# Patient Record
Sex: Female | Born: 2000
Health system: Southern US, Community
[De-identification: ages and names within clinical notes are randomized; demographics above are authoritative.]

## PROBLEM LIST (undated history)

## (undated) DIAGNOSIS — F909 Attention-deficit hyperactivity disorder, unspecified type: Secondary | ICD-10-CM

## (undated) DIAGNOSIS — J45990 Exercise induced bronchospasm: Secondary | ICD-10-CM

## (undated) HISTORY — PX: WISDOM TOOTH EXTRACTION: SHX21

---
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
## 2020-02-19 DIAGNOSIS — F902 Attention-deficit hyperactivity disorder, combined type: Secondary | ICD-10-CM | POA: Diagnosis not present

## 2020-03-26 DIAGNOSIS — K0889 Other specified disorders of teeth and supporting structures: Secondary | ICD-10-CM | POA: Diagnosis not present

## 2020-03-26 DIAGNOSIS — F32A Depression, unspecified: Secondary | ICD-10-CM | POA: Diagnosis not present

## 2020-03-26 DIAGNOSIS — F900 Attention-deficit hyperactivity disorder, predominantly inattentive type: Secondary | ICD-10-CM | POA: Diagnosis not present

## 2020-05-06 DIAGNOSIS — F900 Attention-deficit hyperactivity disorder, predominantly inattentive type: Secondary | ICD-10-CM | POA: Diagnosis not present

## 2020-05-06 DIAGNOSIS — F419 Anxiety disorder, unspecified: Secondary | ICD-10-CM | POA: Diagnosis not present

## 2020-06-04 DIAGNOSIS — J020 Streptococcal pharyngitis: Secondary | ICD-10-CM | POA: Diagnosis not present

## 2020-07-17 DIAGNOSIS — F902 Attention-deficit hyperactivity disorder, combined type: Secondary | ICD-10-CM | POA: Diagnosis not present

## 2020-08-11 DIAGNOSIS — F902 Attention-deficit hyperactivity disorder, combined type: Secondary | ICD-10-CM | POA: Diagnosis not present

## 2021-01-09 DIAGNOSIS — Z113 Encounter for screening for infections with a predominantly sexual mode of transmission: Secondary | ICD-10-CM | POA: Diagnosis not present

## 2021-04-07 ENCOUNTER — Ambulatory Visit: Payer: Federal, State, Local not specified - PPO | Admitting: Nurse Practitioner

## 2021-04-07 ENCOUNTER — Encounter: Payer: Self-pay | Admitting: Nurse Practitioner

## 2021-04-07 ENCOUNTER — Other Ambulatory Visit: Payer: Self-pay

## 2021-04-07 VITALS — BP 105/69 | HR 94 | Temp 98.1°F | Ht 62.0 in | Wt 133.1 lb

## 2021-04-07 DIAGNOSIS — F411 Generalized anxiety disorder: Secondary | ICD-10-CM | POA: Diagnosis not present

## 2021-04-07 DIAGNOSIS — F988 Other specified behavioral and emotional disorders with onset usually occurring in childhood and adolescence: Secondary | ICD-10-CM | POA: Insufficient documentation

## 2021-04-07 DIAGNOSIS — Z7689 Persons encountering health services in other specified circumstances: Secondary | ICD-10-CM

## 2021-04-07 DIAGNOSIS — Z30011 Encounter for initial prescription of contraceptive pills: Secondary | ICD-10-CM

## 2021-04-07 MED ORDER — ESCITALOPRAM OXALATE 10 MG PO TABS: 10.0000 mg | ORAL_TABLET | Freq: Every day | ORAL | 1 refills | Status: DC

## 2021-04-07 MED ORDER — SPRINTEC 28 0.25-35 MG-MCG PO TABS: 1.0000 | ORAL_TABLET | Freq: Every day | ORAL | 11 refills | Status: DC

## 2021-04-07 MED ORDER — LISDEXAMFETAMINE DIMESYLATE 40 MG PO CAPS: 40.0000 mg | ORAL_CAPSULE | Freq: Every day | ORAL | 0 refills | Status: DC

## 2021-04-07 NOTE — Progress Notes (Signed)
? ?New Patient Office Visit ? ?Subjective:  ?Patient ID: Christina DecampSarah Khan, female    DOB: Feb 04, 2000  Age: 21 y.o. MRN: [id number] ? ?CC:  ?Chief Complaint  ?Patient presents with  ? New Patient (Initial Visit)  ? ? ?HPI ?Christina DecampSarah Khan presents to establish new primary care provider. [facility]. The patient has history od depression/anxiety. Currently takes lexapro 10mg  daily. States that it was doing so well at one time, she tried coming off the medication. She states that she did have to restart the medication as she started having symptoms again.  ?The patient is a LawyerCNA at [employer]. She does take vyvanse 40mg  daily on days when she is working. She does take "holiday" from the medication on days off. She states that this works well to keep her on track and focused while at work.  ?She does take sprintec for birth control. She states that menstrual cycles are light, regular, and with very little cramping. She does need refills for this today.  ? ?Past Medical History:  ?Diagnosis Date  ? ADHD   ? Exercise-induced asthma   ? ? ?Past Surgical History:  ?Procedure Laterality Date  ? WISDOM TOOTH EXTRACTION    ? ? ?Family History  ?Problem Relation Age of Onset  ? Healthy Mother   ? High Cholesterol Mother   ? High Cholesterol Father   ? Healthy Father   ? High blood pressure Father   ? Cancer Maternal Grandmother   ? Alcoholism Maternal Grandmother   ? Alcoholism Maternal Grandfather   ? Heart attack Maternal Grandfather   ? ? ?Social History  ? ?Socioeconomic History  ? Marital status: Single  ?  Spouse name: Not on file  ? Number of children: Not on file  ? Years of education: Not on file  ? Highest education level: Not on file  ?Occupational History  ? Not on file  ?Tobacco Use  ? Smoking status: Never  ? Smokeless tobacco: Never  ?Vaping Use  ? Vaping Use: Never used  ?Substance and Sexual Activity  ? Alcohol use: Not Currently  ?  Alcohol/week: 0.0 standard drinks  ?  Drug use: Never  ? Sexual activity: Not Currently  ?Other Topics Concern  ? Not on file  ?Social History Narrative  ? Not on file  ? ?Social Determinants of Health  ? ?Financial Resource Strain: Not on file  ?Food Insecurity: Not on file  ?Transportation Needs: Not on file  ?Physical Activity: Not on file  ?Stress: Not on file  ?Social Connections: Not on file  ?Intimate Partner Violence: Not on file  ? ? ?ROS ?Review of Systems  ?Constitutional:  Negative for activity change, appetite change, chills, fatigue and fever.  ?HENT:  Negative for congestion, postnasal drip, rhinorrhea, sinus pressure, sinus pain, sneezing and sore throat.   ?Eyes: Negative.   ?Respiratory:  Negative for cough, chest tightness, shortness of breath and wheezing.   ?Cardiovascular:  Negative for chest pain and palpitations.  ?Gastrointestinal:  Negative for abdominal pain, constipation, diarrhea, nausea and vomiting.  ?Endocrine: Negative for cold intolerance, heat intolerance, polydipsia and polyuria.  ?Genitourinary:  Negative for dyspareunia, dysuria, flank pain, frequency and urgency.  ?Musculoskeletal:  Negative for arthralgias, back pain and myalgias.  ?Skin:  Negative for rash.  ?Allergic/Immunologic: Negative for environmental allergies.  ?Neurological:  Negative for dizziness, weakness and headaches.  ?Hematological:  Negative for adenopathy.  ?Psychiatric/Behavioral:  Positive for decreased concentration and dysphoric mood. The patient  is not nervous/anxious.   ?     Well managed on current medications  ? ?Objective:  ? ?Today's Vitals  ? 04/07/21 1357  ?BP: 105/69  ?Pulse: 94  ?Temp: 98.1 ?F (36.7 ?C)  ?SpO2: 100%  ?Weight: 133 lb 1.9 oz (60.4 kg)  ?Height: 5\' 2"  (1.575 m)  ? ?Body mass index is 24.35 kg/m?.  ? ?Physical Exam ?Vitals and nursing note reviewed.  ?Constitutional:   ?   Appearance: Normal appearance. She is well-developed.  ?HENT:  ?   Head: Normocephalic and atraumatic.  ?Eyes:  ?   Pupils: Pupils are equal, round,  and reactive to light.  ?Cardiovascular:  ?   Rate and Rhythm: Normal rate and regular rhythm.  ?   Pulses: Normal pulses.  ?   Heart sounds: Normal heart sounds.  ?Pulmonary:  ?   Effort: Pulmonary effort is normal.  ?   Breath sounds: Normal breath sounds.  ?Abdominal:  ?   Palpations: Abdomen is soft.  ?Musculoskeletal:     ?   General: Normal range of motion.  ?   Cervical back: Normal range of motion and neck supple.  ?Lymphadenopathy:  ?   Cervical: No cervical adenopathy.  ?Skin: ?   General: Skin is warm and dry.  ?   Capillary Refill: Capillary refill takes less than 2 seconds.  ?Neurological:  ?   General: No focal deficit present.  ?   Mental Status: She is alert and oriented to person, place, and time.  ?Psychiatric:     ?   Mood and Affect: Mood normal.     ?   Behavior: Behavior normal.     ?   Thought Content: Thought content normal.     ?   Judgment: Judgment normal.  ? ? ?Assessment & Plan:  ?1. Generalized anxiety disorder ?Well managed on curent dose lexapro. Continue 10mg  daily. New prescription sent to her pharmacy today  ?- escitalopram (LEXAPRO) 10 MG tablet; Take 1 tablet (10 mg total) by mouth at bedtime.  Dispense: 90 tablet; Refill: 1 ? ?2. Attention deficit disorder (ADD) without hyperactivity ?Doing well on current dose vyvanse 40mg . May conitnue daily as needed. Two 30 day prescriptions sent to her pharmacy. Dates are 04/07/2021 and 05/06/2021 ?- lisdexamfetamine (VYVANSE) 40 MG capsule; Take 1 capsule (40 mg total) by mouth daily.  Dispense: 30 capsule; Refill: 0 ? ?3. Encounter for prescription of oral contraceptives ?Renew OCP.  ?- SPRINTEC 28 0.25-35 MG-MCG tablet; Take 1 tablet by mouth daily.  Dispense: 28 tablet; Refill: 11 ? ?4. Encounter to establish care ?Appointment today to establish new primary care provider   ? ?  ?Problem List Items Addressed This Visit   ? ?  ? Other  ? Generalized anxiety disorder - Primary  ? Relevant Medications  ? escitalopram (LEXAPRO) 10 MG tablet  ?  Attention deficit disorder (ADD) without hyperactivity  ? Relevant Medications  ? lisdexamfetamine (VYVANSE) 40 MG capsule  ? ?Other Visit Diagnoses   ? ? Encounter for prescription of oral contraceptives      ? Relevant Medications  ? SPRINTEC 28 0.25-35 MG-MCG tablet  ? Encounter to establish care      ? ?  ? ? ?Outpatient Encounter Medications as of 04/07/2021  ?Medication Sig  ? Adapalene (DIFFERIN) 0.3 % gel Apply 1 application topically at bedtime.  ? [DISCONTINUED] escitalopram (LEXAPRO) 10 MG tablet Take 10 mg by mouth at bedtime.  ? [DISCONTINUED] lisdexamfetamine (VYVANSE) 20 MG capsule TAKE  1 CAPSULE BY MOUTH DAILY IN THE MORNING X 5 DAYS. IF TOLERATED, START 2 CAPS IN MORNING  ? [DISCONTINUED] SPRINTEC 28 0.25-35 MG-MCG tablet Take 1 tablet by mouth daily.  ? escitalopram (LEXAPRO) 10 MG tablet Take 1 tablet (10 mg total) by mouth at bedtime.  ? lisdexamfetamine (VYVANSE) 40 MG capsule Take 1 capsule (40 mg total) by mouth daily.  ? SPRINTEC 28 0.25-35 MG-MCG tablet Take 1 tablet by mouth daily.  ? [DISCONTINUED] benzonatate (TESSALON) 100 MG capsule Take 1 capsule (100 mg total) by mouth 3 (three) times daily as needed for cough.  ? [DISCONTINUED] lisdexamfetamine (VYVANSE) 40 MG capsule Take 1 capsule (40 mg total) by mouth daily.  ? [DISCONTINUED] Sulfacetamide Sodium-Sulfur (SULFACLEANSE 8/4) 8-4 % SUSP Apply to skin qd-bid  ? ?No facility-administered encounter medications on file as of 04/07/2021.  ? ? ?Follow-up: Return in about 6 weeks (around 05/19/2021) for health maintenance exam.  ? ?Carlean Jews, NP ? ?

## 2021-05-19 ENCOUNTER — Ambulatory Visit (INDEPENDENT_AMBULATORY_CARE_PROVIDER_SITE_OTHER): Payer: Federal, State, Local not specified - PPO | Admitting: Nurse Practitioner

## 2021-05-19 DIAGNOSIS — Z91199 Patient's noncompliance with other medical treatment and regimen due to unspecified reason: Secondary | ICD-10-CM

## 2021-05-19 NOTE — Progress Notes (Deleted)
Established patient visit   Patient: Christina Khan   DOB: 09-11-2000   21 y.o. Female  MRN: [id number] Visit Date: 05/19/2021   No chief complaint on file.  Subjective    HPI  Visit not started or completed as patient was a no show    Medications: Outpatient Medications Prior to Visit  Medication Sig   Adapalene (DIFFERIN) 0.3 % gel Apply 1 application topically at bedtime.   escitalopram (LEXAPRO) 10 MG tablet Take 1 tablet (10 mg total) by mouth at bedtime.   lisdexamfetamine (VYVANSE) 40 MG capsule Take 1 capsule (40 mg total) by mouth daily.   SPRINTEC 28 0.25-35 MG-MCG tablet Take 1 tablet by mouth daily.   No facility-administered medications prior to visit.    Review of Systems  {Labs (Optional):23779}   Objective    There were no vitals taken for this visit. BP Readings from Last 3 Encounters:  04/07/21 105/69  10/11/19 98/69  06/24/14 114/68 (78 %, Z = 0.77 /  71 %, Z = 0.55)*   *BP percentiles are based on the 2017 AAP Clinical Practice Guideline for girls    Wt Readings from Last 3 Encounters:  04/07/21 133 lb 1.9 oz (60.4 kg)  06/24/14 111 lb (50.3 kg) (61 %, Z= 0.27)*   * Growth percentiles are based on CDC (Girls, 2-20 Years) data.    Physical Exam  ***  No results found for any visits on 05/19/21.  Assessment & Plan     Problem List Items Addressed This Visit   None    No follow-ups on file.         Ronnell Freshwater, NP  [facility] [phone] (phone) [phone] (fax)  [place]

## 2021-07-18 ENCOUNTER — Other Ambulatory Visit: Payer: Self-pay

## 2021-07-18 ENCOUNTER — Emergency Department (HOSPITAL_BASED_OUTPATIENT_CLINIC_OR_DEPARTMENT_OTHER)
Admission: EM | Admit: 2021-07-18 | Discharge: 2021-07-19 | Disposition: A | Discharge status: Home / Self Care | Payer: Federal, State, Local not specified - PPO | Attending: Emergency Medicine | Admitting: Emergency Medicine

## 2021-07-18 DIAGNOSIS — D72829 Elevated white blood cell count, unspecified: Secondary | ICD-10-CM | POA: Diagnosis not present

## 2021-07-18 DIAGNOSIS — N938 Other specified abnormal uterine and vaginal bleeding: Secondary | ICD-10-CM | POA: Diagnosis not present

## 2021-07-18 DIAGNOSIS — N179 Acute kidney failure, unspecified: Secondary | ICD-10-CM | POA: Insufficient documentation

## 2021-07-18 DIAGNOSIS — N390 Urinary tract infection, site not specified: Secondary | ICD-10-CM | POA: Diagnosis not present

## 2021-07-18 DIAGNOSIS — R1030 Lower abdominal pain, unspecified: Secondary | ICD-10-CM

## 2021-07-18 DIAGNOSIS — R1032 Left lower quadrant pain: Secondary | ICD-10-CM | POA: Diagnosis not present

## 2021-07-18 DIAGNOSIS — F419 Anxiety disorder, unspecified: Secondary | ICD-10-CM | POA: Diagnosis not present

## 2021-07-18 DIAGNOSIS — J45998 Other asthma: Secondary | ICD-10-CM | POA: Insufficient documentation

## 2021-07-18 DIAGNOSIS — N12 Tubulo-interstitial nephritis, not specified as acute or chronic: Secondary | ICD-10-CM | POA: Insufficient documentation

## 2021-07-18 LAB — WET PREP, GENITAL
Clue Cells Wet Prep HPF POC: NONE SEEN
Sperm: NONE SEEN
Trich, Wet Prep: NONE SEEN
WBC, Wet Prep HPF POC: <10 (ref <10)
Yeast Wet Prep HPF POC: NONE SEEN

## 2021-07-18 LAB — CBC
HCT: 36 % (ref 36.0–46.0)
Hemoglobin: 11.7 g/dL — ABNORMAL LOW (ref 12.0–15.0)
MCH: 28.1 pg (ref 26.0–34.0)
MCHC: 32.5 g/dL (ref 30.0–36.0)
MCV: 86.3 fL (ref 80.0–100.0)
Platelets: 252 10*3/uL (ref 150–400)
RBC: 4.17 MIL/uL (ref 3.87–5.11)
RDW: 14 % (ref 11.5–15.5)
WBC: 13.6 10*3/uL — ABNORMAL HIGH (ref 4.0–10.5)
nRBC: 0 % (ref 0.0–0.2)

## 2021-07-18 LAB — COMPREHENSIVE METABOLIC PANEL
ALT: 23 U/L (ref 0–44)
AST: 52 U/L — ABNORMAL HIGH (ref 15–41)
Albumin: 3.9 g/dL (ref 3.5–5.0)
Alkaline Phosphatase: 37 U/L — ABNORMAL LOW (ref 38–126)
Anion gap: 12 (ref 5–15)
BUN: 9 mg/dL (ref 6–20)
CO2: 22 mmol/L (ref 22–32)
Calcium: 9.4 mg/dL (ref 8.9–10.3)
Chloride: 100 mmol/L (ref 98–111)
Creatinine, Ser: 0.84 mg/dL (ref 0.44–1.00)
GFR, Estimated: >60 mL/min (ref >60)
Glucose, Bld: 181 mg/dL — ABNORMAL HIGH (ref 70–99)
Potassium: 3.2 mmol/L — ABNORMAL LOW (ref 3.5–5.1)
Sodium: 134 mmol/L — ABNORMAL LOW (ref 135–145)
Total Bilirubin: 0.8 mg/dL (ref 0.3–1.2)
Total Protein: 7.2 g/dL (ref 6.5–8.1)

## 2021-07-18 LAB — URINALYSIS, ROUTINE W REFLEX MICROSCOPIC
Bilirubin Urine: NEGATIVE
Glucose, UA: NEGATIVE mg/dL
Ketones, ur: 40 mg/dL — AB
Nitrite: NEGATIVE
Protein, ur: 30 mg/dL — AB
Specific Gravity, Urine: 1.015 (ref 1.005–1.030)
Trans Epithel, UA: 6
WBC, UA: >50 WBC/hpf — ABNORMAL HIGH (ref 0–5)
pH: 6 (ref 5.0–8.0)

## 2021-07-18 LAB — PREGNANCY, URINE: Preg Test, Ur: NEGATIVE

## 2021-07-18 LAB — LIPASE, BLOOD: Lipase: <10 U/L — ABNORMAL LOW (ref 11–51)

## 2021-07-18 MED ORDER — SODIUM CHLORIDE 0.9 % IV SOLN
1.0000 g | Freq: Once | INTRAVENOUS | Status: AC
  Administered 2021-07-18: 1 g via INTRAVENOUS
  Filled 2021-07-18: qty 10

## 2021-07-18 NOTE — ED Provider Notes (Signed)
Emergency Department Provider Note   I have reviewed the triage vital signs and the nursing notes.   HISTORY  Chief Complaint Abdominal Pain   HPI Christina Khan is a 21 y.o. female past history reviewed below presents emergency department with lower abdominal discomfort over the past 2 to 3 days.  She states she is got some suprapubic and lower abdominal pain which at times becomes severe in the left lower quadrant.  She states she has episodes where she is "doubled over in pain."  No discomfort currently.  She has had some associated vaginal spotting.  She went to urgent care earlier today and was started empirically on Macrobid for presumed UTI but states she did not tell them about the sharp/severe pain in the lower abdomen from time to time.  No pelvic exam or other imaging was done.  She had fever this evening according to mom to 103 F. She is not having pain in the flank or back.    Past Medical History:  Diagnosis Date   ADHD    Exercise-induced asthma     Review of Systems  Constitutional: No fever/chills Eyes: No visual changes. ENT: No sore throat. Cardiovascular: Denies chest pain. Respiratory: Denies shortness of breath. Gastrointestinal: Positive lower abdominal pain.  No nausea, no vomiting.  No diarrhea.  No constipation. Positive vaginal spotting.  Genitourinary: Negative for dysuria. Musculoskeletal: Negative for back pain. Skin: Negative for rash. Neurological: Negative for headaches, focal weakness or numbness.   ____________________________________________   PHYSICAL EXAM:  VITAL SIGNS: ED Triage Vitals  Enc Vitals Group     BP 07/18/21 2022 106/67     Pulse Rate 07/18/21 2022 98     Resp 07/18/21 2022 14     Temp 07/18/21 2022 98.3 F (36.8 C)     Temp src --      SpO2 07/18/21 2022 99 %     Weight 07/18/21 2023 130 lb (59 kg)     Height 07/18/21 2023 5\' 3"  (1.6 m)   Constitutional: Alert and oriented. Well appearing and in no acute  distress. Eyes: Conjunctivae are normal. Head: Atraumatic. Nose: No congestion/rhinnorhea. Mouth/Throat: Mucous membranes are moist.   Neck: No stridor.   Cardiovascular: Normal rate, regular rhythm. Good peripheral circulation. Grossly normal heart sounds.   Respiratory: Normal respiratory effort.  No retractions. Lungs CTAB. Gastrointestinal: Soft and nontender. No distention.  Genitourinary: Pelvic exam performed with patient's verbal consent and nurse tech chaperone.  Normal external genitalia.  No blood in the vaginal vault.  No exquisite tenderness suggesting PID.  Physiologic discharge. Normal cervix.  Musculoskeletal: No lower extremity tenderness nor edema. No gross deformities of extremities. Neurologic:  Normal speech and language. No gross focal neurologic deficits are appreciated.  Skin:  Skin is warm, dry and intact. No rash noted.  ____________________________________________   LABS (all labs ordered are listed, but only abnormal results are displayed)  Labs Reviewed  LIPASE, BLOOD - Abnormal; Notable for the following components:      Result Value   Lipase <10 (*)    All other components within normal limits  COMPREHENSIVE METABOLIC PANEL - Abnormal; Notable for the following components:   Sodium 134 (*)    Potassium 3.2 (*)    Glucose, Bld 181 (*)    AST 52 (*)    Alkaline Phosphatase 37 (*)    All other components within normal limits  CBC - Abnormal; Notable for the following components:   WBC 13.6 (*)  Hemoglobin 11.7 (*)    All other components within normal limits  URINALYSIS, ROUTINE W REFLEX MICROSCOPIC - Abnormal; Notable for the following components:   APPearance CLOUDY (*)    Hgb urine dipstick MODERATE (*)    Ketones, ur 40 (*)    Protein, ur 30 (*)    Leukocytes,Ua LARGE (*)    WBC, UA >50 (*)    Bacteria, UA MANY (*)    All other components within normal limits  WET PREP, GENITAL  PREGNANCY, URINE  GC/CHLAMYDIA PROBE AMP ([place]) NOT  AT [facility]   ____________________________________________   PROCEDURES  Procedure(s) performed:   Procedures  None ____________________________________________   INITIAL IMPRESSION / ASSESSMENT AND PLAN / ED COURSE  Pertinent labs & imaging results that were available during my care of the patient were reviewed by me and considered in my medical decision making (see chart for details).   This patient is Presenting for Evaluation of abdominal pain, which does require a range of treatment options, and is a complaint that involves a high risk of morbidity and mortality.  The Differential Diagnoses includes but is not exclusive to ectopic pregnancy, ovarian cyst, ovarian torsion, acute appendicitis, urinary tract infection, endometriosis, bowel obstruction, hernia, colitis, renal colic, gastroenteritis, volvulus etc.   Critical Interventions-    Medications  cefTRIAXone (ROCEPHIN) 1 g in sodium chloride 0.9 % 100 mL IVPB ( Intravenous Stopped 07/18/21 2357)  ketorolac (TORADOL) 15 MG/ML injection 15 mg (15 mg Intravenous Given 07/19/21 0419)   Reassessment after intervention:  symptoms improved.    I did obtain Additional Historical Information from Mom at bedside.   Clinical Laboratory Tests Ordered, included CBC with leukocytosis to 13.6.  Acute kidney injury.  Radiologic Tests Ordered, TVUS not available at this facility. Have arrange transfer to [facility] for test to be performed.   Cardiac Monitor Tracing which shows NSR.   Social Determinants of Health Risk no smoking history.   Medical Decision Making: Summary: Patient presents emergency department with lower abdominal pain.  She does have a urinary tract infection but describes episodes of severe/sharp discomfort in the abdomen.  This is been ongoing for the last 2 to 3 days.  Intermittent torsion is a consideration but lower on my differential.  Pelvic exam not consistent with PID.  I wanted to obtain ultrasound of the pelvis  to evaluate for torsion but we do not have ultrasound capability at this time.  Speaking with Dr. Roslynn Amble at the [facility], ED patient will be accepted in transfer for ultrasound.  Reevaluation with update and discussion with patient and Mom at bedside. Will transfer to the [facility] ED for [place]. Covered UTI with abx.   Disposition: transfer   ____________________________________________  FINAL CLINICAL IMPRESSION(S) / ED DIAGNOSES  Final diagnoses:  Lower abdominal pain  Pyelonephritis     NEW OUTPATIENT MEDICATIONS STARTED DURING THIS VISIT:  Discharge Medication List as of 07/19/2021  5:06 AM     START taking these medications   Details  cephALEXin (KEFLEX) 500 MG capsule Take 1 capsule (500 mg total) by mouth 3 (three) times daily for 14 days., Starting Sun 07/19/2021, Until Sun 08/02/2021, Normal    ondansetron (ZOFRAN-ODT) 4 MG disintegrating tablet Take 1 tablet (4 mg total) by mouth every 8 (eight) hours as needed for up to 3 days for nausea or vomiting., Starting Sun 07/19/2021, Until Wed 07/22/2021 at 2359, Normal        Note:  This document was prepared using Dragon voice recognition  software and may include unintentional dictation errors.  Alona Bene, MD, [facility]    Jeanne Terrance, Arlyss Repress, MD 07/22/21 [phone]

## 2021-07-18 NOTE — ED Triage Notes (Signed)
POV, pt sts that for one month shes been spotting, lower abd pain and lower left sided sharp pain began a couple days ago, fever and chills at home, went to UC and they did UA and no UTI. Amb, alert and oriented, x 4.

## 2021-07-19 ENCOUNTER — Emergency Department (HOSPITAL_COMMUNITY): Payer: Federal, State, Local not specified - PPO

## 2021-07-19 DIAGNOSIS — R1032 Left lower quadrant pain: Secondary | ICD-10-CM | POA: Diagnosis not present

## 2021-07-19 MED ORDER — ONDANSETRON 4 MG PO TBDP: 4.0000 mg | ORAL_TABLET | Freq: Three times a day (TID) | ORAL | 0 refills | Status: AC | PRN

## 2021-07-19 MED ORDER — KETOROLAC TROMETHAMINE 15 MG/ML IJ SOLN
15.0000 mg | Freq: Once | INTRAMUSCULAR | Status: AC
  Administered 2021-07-19: 15 mg via INTRAVENOUS
  Filled 2021-07-19: qty 1

## 2021-07-19 MED ORDER — CEPHALEXIN 500 MG PO CAPS: 500.0000 mg | ORAL_CAPSULE | Freq: Three times a day (TID) | ORAL | 0 refills | Status: AC

## 2021-07-20 LAB — GC/CHLAMYDIA PROBE AMP (~~LOC~~) NOT AT ARMC
Chlamydia: NEGATIVE
Comment: NEGATIVE
Comment: NORMAL
Neisseria Gonorrhea: NEGATIVE

## 2021-09-07 ENCOUNTER — Emergency Department (HOSPITAL_BASED_OUTPATIENT_CLINIC_OR_DEPARTMENT_OTHER)
Admission: EM | Admit: 2021-09-07 | Discharge: 2021-09-07 | Disposition: A | Discharge status: Home / Self Care | Payer: Federal, State, Local not specified - PPO | Attending: Emergency Medicine | Admitting: Emergency Medicine

## 2021-09-07 ENCOUNTER — Encounter (HOSPITAL_BASED_OUTPATIENT_CLINIC_OR_DEPARTMENT_OTHER): Payer: Self-pay | Admitting: Emergency Medicine

## 2021-09-07 ENCOUNTER — Other Ambulatory Visit: Payer: Self-pay

## 2021-09-07 DIAGNOSIS — N3000 Acute cystitis without hematuria: Secondary | ICD-10-CM | POA: Insufficient documentation

## 2021-09-07 DIAGNOSIS — R300 Dysuria: Secondary | ICD-10-CM | POA: Diagnosis not present

## 2021-09-07 LAB — URINALYSIS, ROUTINE W REFLEX MICROSCOPIC
Bilirubin Urine: NEGATIVE
Glucose, UA: NEGATIVE mg/dL
Ketones, ur: NEGATIVE mg/dL
Nitrite: NEGATIVE
Protein, ur: 100 mg/dL — AB
RBC / HPF: >50 RBC/hpf — ABNORMAL HIGH (ref 0–5)
Specific Gravity, Urine: 1.015 (ref 1.005–1.030)
WBC, UA: >50 WBC/hpf — ABNORMAL HIGH (ref 0–5)
pH: 6 (ref 5.0–8.0)

## 2021-09-07 LAB — PREGNANCY, URINE: Preg Test, Ur: NEGATIVE

## 2021-09-07 MED ORDER — CEFTRIAXONE SODIUM 1 G IJ SOLR
1.0000 g | Freq: Once | INTRAMUSCULAR | Status: AC
  Administered 2021-09-07: 1 g via INTRAMUSCULAR
  Filled 2021-09-07: qty 10

## 2021-09-07 MED ORDER — CEPHALEXIN 500 MG PO CAPS: 500.0000 mg | ORAL_CAPSULE | Freq: Three times a day (TID) | ORAL | 0 refills | Status: DC

## 2021-09-07 MED ORDER — LIDOCAINE HCL (PF) 1 % IJ SOLN
INTRAMUSCULAR | Status: AC
  Filled 2021-09-07: qty 5

## 2021-09-07 NOTE — ED Provider Notes (Signed)
[facility] EMERGENCY DEPT Provider Note   CSN: [id number] Arrival date & time: 09/07/21  0998     History  Chief Complaint  Patient presents with   Dysuria    Christina Khan is a 21 y.o. female.  HPI     This is a 21 year old female who presents with urinary symptoms.  Patient reports 1 week history of dysuria and cloudy urine.  She has been taking Azo.  This morning she woke up with some right-sided abdominal discomfort.  This is the first time it has lateralize.  She has not been able to see a doctor.  She denies back pain or fevers.  She states the symptoms are similar to when she was treated approximately 1 month ago.  She denies any vaginal discharge or concerns for STDs.  She reports a negative urine pregnancy test.  Chart reviewed.  No urine culture available.  She did have both pelvic exam with STD testing and pelvic ultrasound to rule out torsion at the end of June.  She was treated with Keflex for UTI.  Home Medications Prior to Admission medications   Medication Sig Start Date End Date Taking? Authorizing Provider  Adapalene (DIFFERIN) 0.3 % gel Apply 1 application topically at bedtime. 04/23/19   Deirdre Evener, MD  escitalopram (LEXAPRO) 10 MG tablet Take 1 tablet (10 mg total) by mouth at bedtime. 04/07/21   Carlean Jews, NP  ibuprofen (ADVIL) 200 MG tablet Take 800 mg by mouth every 6 (six) hours as needed for headache or moderate pain.    [provider]  lisdexamfetamine (VYVANSE) 40 MG capsule Take 1 capsule (40 mg total) by mouth daily. Patient taking differently: Take 40 mg by mouth See admin instructions. Monday-Friday (work days) 04/07/21   Carlean Jews, NP  SPRINTEC 28 0.25-35 MG-MCG tablet Take 1 tablet by mouth daily. Patient taking differently: Take 1 tablet by mouth at bedtime. 04/07/21   Carlean Jews, NP      Allergies    Patient has no known allergies.    Review of Systems   Review of Systems  Constitutional:   Negative for fever.  Gastrointestinal:  Positive for abdominal pain.  Genitourinary:  Positive for dysuria and frequency.  All other systems reviewed and are negative.   Physical Exam Updated Vital Signs BP 122/83   Pulse 86   Temp 99.3 F (37.4 C)   Resp 18   Ht 1.6 m (5\' 3" )   Wt 56.7 kg   LMP 09/04/2021   SpO2 99%   BMI 22.14 kg/m  Physical Exam Vitals and nursing note reviewed.  Constitutional:      Appearance: She is well-developed.  HENT:     Head: Normocephalic and atraumatic.  Eyes:     Pupils: Pupils are equal, round, and reactive to light.  Cardiovascular:     Rate and Rhythm: Normal rate and regular rhythm.     Heart sounds: Normal heart sounds.  Pulmonary:     Effort: Pulmonary effort is normal. No respiratory distress.     Breath sounds: No wheezing.  Abdominal:     Palpations: Abdomen is soft.     Tenderness: There is no abdominal tenderness. There is no right CVA tenderness, left CVA tenderness, guarding or rebound.  Musculoskeletal:     Cervical back: Neck supple.  Skin:    General: Skin is warm and dry.  Neurological:     Mental Status: She is alert and oriented to person, place, and  time.  Psychiatric:        Mood and Affect: Mood normal.     ED Results / Procedures / Treatments   Labs (all labs ordered are listed, but only abnormal results are displayed) Labs Reviewed  URINE CULTURE  PREGNANCY, URINE  URINALYSIS, ROUTINE W REFLEX MICROSCOPIC    EKG None  Radiology No results found.  Procedures Procedures    Medications Ordered in ED Medications - No data to display  ED Course/ Medical Decision Making/ A&P                           Medical Decision Making Amount and/or Complexity of Data Reviewed Labs: ordered.   This patient presents to the ED for concern of dysuria, this involves an extensive number of treatment options, and is a complaint that carries with it a high risk of complications and morbidity.  I considered  the following differential and admission for this acute, potentially life threatening condition.  The differential diagnosis includes current UTI/pyelonephritis, STD, torsion, PID  MDM:    This is a 21 year old female who presents with concerns for recurrent urinary symptoms.  She is nontoxic and vital signs are reassuring.  She is afebrile.  She has no CVA tenderness.  She has a nontender abdominal exam.  Will obtain urinalysis and urine pregnancy.  Given nontender exam, would have lower suspicion for ovarian pathology.  Patient adamantly denies STDs and had STD testing 6 weeks ago.  Will defer at this time.  (Labs, imaging, consults)  Labs: I Ordered, and personally interpreted labs.  The pertinent results include: Urinalysis, urine culture, urine pregnancy  Imaging Studies ordered: I ordered imaging studies including none I independently visualized and interpreted imaging. I agree with the radiologist interpretation  Additional history obtained from review.  External records from outside source obtained and reviewed including recent imaging  Cardiac Monitoring: The patient was maintained on a cardiac monitor.  I personally viewed and interpreted the cardiac monitored which showed an underlying rhythm of: Normal sinus rhythm  Reevaluation: After the interventions noted above, I reevaluated the patient and found that they have :stayed the same  Social Determinants of Health: Lives independently  Disposition: Pending  Co morbidities that complicate the patient evaluation  Past Medical History:  Diagnosis Date   ADHD    Exercise-induced asthma      Medicines No orders of the defined types were placed in this encounter.   I have reviewed the patients home medicines and have made adjustments as needed  Problem List / ED Course: Problem List Items Addressed This Visit   None               Final Clinical Impression(s) / ED Diagnoses Final diagnoses:  None     Rx / DC Orders ED Discharge Orders     None         Chaunice Obie, Mayer Masker, MD 09/07/21 [phone]

## 2021-09-07 NOTE — ED Notes (Signed)
Patient verbalizes understanding of discharge instructions. Opportunity for questioning and answers were provided. Patient discharged from ED.  °

## 2021-09-07 NOTE — ED Triage Notes (Signed)
Pt c/o urinary frequency and dysuria. Pt states that she was diagnosed with a kidney infection last month and finished her abx and was feeling better.

## 2021-09-09 LAB — URINE CULTURE: Culture: >=100000 — AB

## 2021-09-10 ENCOUNTER — Telehealth: Payer: Self-pay | Admitting: *Deleted

## 2021-09-10 NOTE — Telephone Encounter (Signed)
Post ED Visit - Positive Culture Follow-up  Culture report reviewed by antimicrobial stewardship pharmacist: [facility] []  Nathan Batchelder, Pharm.D. []  , Pharm.D., BCPS AQ-ID []  , Pharm.D., BCPS []  Celedonio Miyamoto, Pharm.D., BCPS []  [place], Garvin Fila.D., BCPS, AAHIVP []  , Pharm.D., BCPS, AAHIVP []  Georgina Pillion, PharmD, BCPS []  , PharmD, BCPS []  [place], PharmD, BCPS []  [address], PharmD []  , PharmD, BCPS []  Estella Husk, PharmD  Pharmacy Team []  Lysle Pearl, PharmD []  , PharmD []  Phillips Climes, PharmD []  , Rph []  Agapito Games) , PharmD []  Verlan Friends, PharmD []  , PharmD []  Mervyn Gay, PharmD []  , PharmD []  Vinnie Level, PharmD []  [facility], PharmD []  , PharmD []  Len Childs, PharmD   Positive urine culture Treated with Cephalexin, organism sensitive to the same and no further patient follow-up is required at this time.  , Pharm D  Greer Pickerel 09/10/2021, 9:58 AM

## 2021-09-22 NOTE — Progress Notes (Unsigned)
Established patient visit   Patient: Christina Khan   DOB: 2000/09/06   21 y.o. Female  MRN: [id number] Visit Date: 09/23/2021   No chief complaint on file.  Subjective    HPI  Follow up -recurring UTI -has been treated on two  separate  occassions for UTI in last few months  -treated 07/18/2021 per ED for pyelonephritis - treated with  keflex.  -treated 2nd time per ER on 09/07/2021. Treated with 1 gm. Rocephin. Discharged with cephalexin  -recheck urine dip today - keflex appropriate antibiotic.    Medications: Outpatient Medications Prior to Visit  Medication Sig   Adapalene (DIFFERIN) 0.3 % gel Apply 1 application topically at bedtime.   cephALEXin (KEFLEX) 500 MG capsule Take 1 capsule (500 mg total) by mouth 3 (three) times daily.   escitalopram (LEXAPRO) 10 MG tablet Take 1 tablet (10 mg total) by mouth at bedtime.   ibuprofen (ADVIL) 200 MG tablet Take 800 mg by mouth every 6 (six) hours as needed for headache or moderate pain.   lisdexamfetamine (VYVANSE) 40 MG capsule Take 1 capsule (40 mg total) by mouth daily. (Patient taking differently: Take 40 mg by mouth See admin instructions. Monday-Friday (work days))   SPRINTEC 28 0.25-35 MG-MCG tablet Take 1 tablet by mouth daily. (Patient taking differently: Take 1 tablet by mouth at bedtime.)   No facility-administered medications prior to visit.    Review of Systems  {Labs (Optional):23779}   Objective    LMP 09/04/2021  BP Readings from Last 3 Encounters:  09/07/21 102/68  07/19/21 105/64  04/07/21 105/69    Wt Readings from Last 3 Encounters:  09/07/21 125 lb (56.7 kg)  07/18/21 130 lb (59 kg)  04/07/21 133 lb 1.9 oz (60.4 kg)    Physical Exam  ***  No results found for any visits on 09/23/21.  Assessment & Plan     Problem List Items Addressed This Visit   None    No follow-ups on file.         Carlean Jews, NP  [facility] at [facility] [phone] (phone) [phone]  (fax)  [facility]

## 2021-09-23 ENCOUNTER — Ambulatory Visit (INDEPENDENT_AMBULATORY_CARE_PROVIDER_SITE_OTHER): Payer: Federal, State, Local not specified - PPO | Admitting: Nurse Practitioner

## 2021-09-23 ENCOUNTER — Encounter: Payer: Self-pay | Admitting: Nurse Practitioner

## 2021-09-23 VITALS — BP 92/59 | HR 93 | Ht 63.0 in | Wt 131.0 lb

## 2021-09-23 DIAGNOSIS — N12 Tubulo-interstitial nephritis, not specified as acute or chronic: Secondary | ICD-10-CM | POA: Insufficient documentation

## 2021-11-02 NOTE — Progress Notes (Signed)
No visit information as patient was a no show for visit

## 2021-11-26 ENCOUNTER — Other Ambulatory Visit: Payer: Self-pay | Admitting: Nurse Practitioner

## 2021-11-26 DIAGNOSIS — F411 Generalized anxiety disorder: Secondary | ICD-10-CM

## 2021-12-06 DIAGNOSIS — N390 Urinary tract infection, site not specified: Secondary | ICD-10-CM | POA: Diagnosis not present

## 2021-12-06 DIAGNOSIS — F419 Anxiety disorder, unspecified: Secondary | ICD-10-CM | POA: Diagnosis not present

## 2021-12-27 NOTE — Progress Notes (Unsigned)
Complete physical exam   Patient: Christina Khan   DOB: 06/05/2000   21 y.o. Female  MRN: [id number] Visit Date: 12/28/2021    No chief complaint on file.  Subjective    Christina Khan is a 21 y.o. female who presents today for a complete physical exam.  She reports consuming a {diet types:17450} diet. {Exercise:19826} She generally feels {well/fairly well/poorly:18703}. She {does/does not:200015} have additional problems to discuss today.   HPI  Annual physical  -?GYN provider  -takes lexapro  -?prescription for vyvanse    Past Medical History:  Diagnosis Date   ADHD    Exercise-induced asthma    Past Surgical History:  Procedure Laterality Date   WISDOM TOOTH EXTRACTION     Social History   Socioeconomic History   Marital status: Single    Spouse name: Not on file   Number of children: Not on file   Years of education: Not on file   Highest education level: Not on file  Occupational History   Not on file  Tobacco Use   Smoking status: Never   Smokeless tobacco: Never  Vaping Use   Vaping Use: Never used  Substance and Sexual Activity   Alcohol use: Not Currently    Alcohol/week: 0.0 standard drinks of alcohol   Drug use: Never   Sexual activity: Not Currently  Other Topics Concern   Not on file  Social History Narrative   Not on file   Social Determinants of Health   Financial Resource Strain: Not on file  Food Insecurity: Not on file  Transportation Needs: Not on file  Physical Activity: Not on file  Stress: Not on file  Social Connections: Not on file  Intimate Partner Violence: Not on file   Family Status  Relation Name Status   Mother  Alive   Father  Alive   MGM  (Not Specified)   MGF  (Not Specified)   Family History  Problem Relation Age of Onset   Healthy Mother    High Cholesterol Mother    High Cholesterol Father    Healthy Father    High blood pressure Father    Cancer Maternal Grandmother    Alcoholism Maternal Grandmother     Alcoholism Maternal Grandfather    Heart attack Maternal Grandfather    No Known Allergies  Patient Care Team: Carlean Jews, NP as PCP - General (Family Medicine)   Medications: Outpatient Medications Prior to Visit  Medication Sig   Adapalene (DIFFERIN) 0.3 % gel Apply 1 application topically at bedtime.   cephALEXin (KEFLEX) 500 MG capsule Take 1 capsule (500 mg total) by mouth 3 (three) times daily.   escitalopram (LEXAPRO) 10 MG tablet TAKE 1 TABLET BY MOUTH EVERYDAY AT BEDTIME   ibuprofen (ADVIL) 200 MG tablet Take 800 mg by mouth every 6 (six) hours as needed for headache or moderate pain.   lisdexamfetamine (VYVANSE) 40 MG capsule Take 1 capsule (40 mg total) by mouth daily. (Patient taking differently: Take 40 mg by mouth See admin instructions. Monday-Friday (work days))   SPRINTEC 28 0.25-35 MG-MCG tablet Take 1 tablet by mouth daily. (Patient taking differently: Take 1 tablet by mouth at bedtime.)   No facility-administered medications prior to visit.    Review of Systems  {Labs (Optional):23779}   Objective    There were no vitals filed for this visit. There is no height or weight on file to calculate BMI.  BP Readings from Last 3 Encounters:  09/23/21 (Abnormal) 92/59  09/07/21 102/68  07/19/21 105/64    Wt Readings from Last 3 Encounters:  09/23/21 131 lb (59.4 kg)  09/07/21 125 lb (56.7 kg)  07/18/21 130 lb (59 kg)     Physical Exam  ***  Last depression screening scores   Row Labels 09/23/2021   10:12 AM 04/07/2021    2:08 PM 06/24/2014    2:30 PM  PHQ 2/9 Scores   Section Header. No data exists in this row.     PHQ - 2 Score   0 0 0  PHQ- 9 Score   0 0 0   Last fall risk screening   Row Labels 04/07/2021    2:08 PM  Fall Risk    Section Header. No data exists in this row.   Falls in the past year?   0  Number falls in past yr:   0  Injury with Fall?   0  Follow up   Falls evaluation completed   Last Audit-C alcohol use screening    No data to display    A score of 3 or more in women, and 4 or more in men indicates increased risk for alcohol abuse, EXCEPT if all of the points are from question 1   No results found for any visits on 12/28/21.  Assessment & Plan    Routine Health Maintenance and Physical Exam  Exercise Activities and Dietary recommendations  Goals   None     Immunization History  Administered Date(s) Administered   PFIZER(Purple Top)SARS-COV-2 Vaccination 05/03/2019, 05/24/2019    Health Maintenance  Topic Date Due   HPV VACCINES (1 - 2-dose series) Never done   HIV Screening  Never done   Hepatitis C Screening  Never done   DTaP/Tdap/Td (1 - Tdap) Never done   INFLUENZA VACCINE  Never done   COVID-19 Vaccine (3 - 2023-24 season) 09/25/2021   PAP-Cervical Cytology Screening  12/20/2021   PAP SMEAR-Modifier  12/20/2021    Discussed health benefits of physical activity, and encouraged her to engage in regular exercise appropriate for her age and condition.  Problem List Items Addressed This Visit   None    No follow-ups on file.        Carlean Jews, NP  [facility] at [facility] [phone] (phone) [phone] (fax)  [facility]

## 2021-12-28 ENCOUNTER — Ambulatory Visit (INDEPENDENT_AMBULATORY_CARE_PROVIDER_SITE_OTHER): Payer: Federal, State, Local not specified - PPO | Admitting: Nurse Practitioner

## 2021-12-28 ENCOUNTER — Encounter: Payer: Self-pay | Admitting: Nurse Practitioner

## 2021-12-28 VITALS — BP 100/67 | HR 72 | Resp 18 | Ht 63.0 in | Wt 134.0 lb

## 2021-12-28 DIAGNOSIS — L700 Acne vulgaris: Secondary | ICD-10-CM

## 2021-12-28 DIAGNOSIS — Z30011 Encounter for initial prescription of contraceptive pills: Secondary | ICD-10-CM

## 2021-12-28 DIAGNOSIS — F411 Generalized anxiety disorder: Secondary | ICD-10-CM | POA: Diagnosis not present

## 2021-12-28 DIAGNOSIS — Z0001 Encounter for general adult medical examination with abnormal findings: Secondary | ICD-10-CM | POA: Diagnosis not present

## 2021-12-28 MED ORDER — SPRINTEC 28 0.25-35 MG-MCG PO TABS: 1.0000 | ORAL_TABLET | Freq: Every day | ORAL | 11 refills | Status: DC

## 2021-12-28 MED ORDER — ADAPALENE 0.3 % EX GEL: 1.0000 "application " | Freq: Every day | CUTANEOUS | 5 refills | Status: DC

## 2022-01-02 DIAGNOSIS — N390 Urinary tract infection, site not specified: Secondary | ICD-10-CM | POA: Diagnosis not present

## 2022-01-02 DIAGNOSIS — F419 Anxiety disorder, unspecified: Secondary | ICD-10-CM | POA: Diagnosis not present

## 2022-01-13 ENCOUNTER — Other Ambulatory Visit: Payer: Self-pay

## 2022-01-13 ENCOUNTER — Telehealth: Payer: Self-pay | Admitting: *Deleted

## 2022-01-13 DIAGNOSIS — N390 Urinary tract infection, site not specified: Secondary | ICD-10-CM

## 2022-01-13 NOTE — Telephone Encounter (Signed)
Called pt mother LVM to contact the office 

## 2022-01-13 NOTE — Telephone Encounter (Signed)
Pts mom calling saying that patient is not sure if she has another UTI and said that they would like to go ahead and get a Urology Referral as discussed previously. Please let patient know once this is placed @[phone]. Christina Khan, CMA

## 2022-01-14 NOTE — Telephone Encounter (Signed)
Called pt she is advised of the referral that was placed 

## 2022-01-21 DIAGNOSIS — N390 Urinary tract infection, site not specified: Secondary | ICD-10-CM | POA: Diagnosis not present

## 2022-01-21 DIAGNOSIS — F419 Anxiety disorder, unspecified: Secondary | ICD-10-CM | POA: Diagnosis not present

## 2022-02-08 DIAGNOSIS — F419 Anxiety disorder, unspecified: Secondary | ICD-10-CM | POA: Diagnosis not present

## 2022-02-08 DIAGNOSIS — N390 Urinary tract infection, site not specified: Secondary | ICD-10-CM | POA: Diagnosis not present

## 2022-02-15 ENCOUNTER — Ambulatory Visit: Payer: Federal, State, Local not specified - PPO | Admitting: Urology

## 2022-02-15 ENCOUNTER — Encounter: Payer: Self-pay | Admitting: Urology

## 2022-02-15 VITALS — BP 105/75 | HR 83 | Ht 63.0 in | Wt 135.0 lb

## 2022-02-15 DIAGNOSIS — Z87440 Personal history of urinary (tract) infections: Secondary | ICD-10-CM

## 2022-02-15 DIAGNOSIS — N390 Urinary tract infection, site not specified: Secondary | ICD-10-CM

## 2022-02-15 LAB — MICROSCOPIC EXAMINATION: Epithelial Cells (non renal): >10 /hpf — AB (ref 0–10)

## 2022-02-15 LAB — URINALYSIS, COMPLETE
Bilirubin, UA: NEGATIVE
Glucose, UA: NEGATIVE
Ketones, UA: NEGATIVE
Leukocytes,UA: NEGATIVE
Nitrite, UA: NEGATIVE
Protein,UA: NEGATIVE
Specific Gravity, UA: 1.01 (ref 1.005–1.030)
Urobilinogen, Ur: 0.2 mg/dL (ref 0.2–1.0)
pH, UA: 7 (ref 5.0–7.5)

## 2022-02-15 MED ORDER — NITROFURANTOIN MACROCRYSTAL 50 MG PO CAPS: ORAL_CAPSULE | ORAL | 0 refills | Status: DC

## 2022-02-15 NOTE — Progress Notes (Signed)
02/15/2022 8:56 AM   [name] [birth year] [id number]  Referring provider: Ronnell Freshwater, NP [place],  [place] [address]  Chief Complaint  Patient presents with   Recurrent UTI    HPI: Christina Khan is a 22 y.o. female referred for evaluation of recurrent UTIs.  Onset around August 2023 Averages 1 UTI monthly and typically occurs right before her menstrual cycle Only symptom is significant increase in urinary frequency which resolves after antibiotic therapy Record review with 1 culture + E. coli.  She typically goes to an urgent care and states she has had 1 positive culture for E. coli and 1 for strep.  She has also had mixed flora. No pyelonephritis Is sexually active and unclear if onset of symptoms may be postcoital CT June 2023 showed no upper tract abnormalities   PMH: Past Medical History:  Diagnosis Date   ADHD    Exercise-induced asthma     Surgical History: Past Surgical History:  Procedure Laterality Date   WISDOM TOOTH EXTRACTION      Home Medications:  Allergies as of 02/15/2022   No Known Allergies      Medication List        Accurate as of February 15, 2022  8:56 AM. If you have any questions, ask your nurse or doctor.          STOP taking these medications    Adapalene 0.3 % gel Commonly known as: Differin Stopped by: Abbie Sons, MD   cephALEXin 500 MG capsule Commonly known as: KEFLEX Stopped by: Abbie Sons, MD   escitalopram 10 MG tablet Commonly known as: LEXAPRO Stopped by: Abbie Sons, MD   lisdexamfetamine 40 MG capsule Commonly known as: VYVANSE Stopped by: Abbie Sons, MD       TAKE these medications    ibuprofen 200 MG tablet Commonly known as: ADVIL Take 800 mg by mouth every 6 (six) hours as needed for headache or moderate pain.   Sprintec 28 0.25-35 MG-MCG tablet Generic drug: norgestimate-ethinyl estradiol Take 1 tablet by mouth at bedtime.         Allergies: No Known Allergies  Family History: Family History  Problem Relation Age of Onset   Healthy Mother    High Cholesterol Mother    High Cholesterol Father    Healthy Father    High blood pressure Father    Cancer Maternal Grandmother    Alcoholism Maternal Grandmother    Alcoholism Maternal Grandfather    Heart attack Maternal Grandfather     Social History:  reports that she has never smoked. She has never used smokeless tobacco. She reports that she does not currently use alcohol. She reports that she does not use drugs.   Physical Exam: BP 105/75   Pulse 83   Ht 5\' 3"  (1.6 m)   Wt 135 lb (61.2 kg)   BMI 23.91 kg/m   Constitutional:  Alert and oriented, No acute distress. HEENT: [place] AT Respiratory: Normal respiratory effort, no increased work of breathing. Psychiatric: Normal mood and affect.  Laboratory Data:  Urinalysis Dipstick 3+ blood Microscopy negative   Pertinent Imaging: CT images personally reviewed and interpreted   CT Renal Stone Study  Narrative CLINICAL DATA:  Flank pain, kidney stone suspected. Lower abdominal pain for 1 month. Left lower quadrant pain a few days ago.  EXAM: CT ABDOMEN AND PELVIS WITHOUT CONTRAST  TECHNIQUE: Multidetector CT imaging of the abdomen and pelvis was performed  following the standard protocol without IV contrast.  RADIATION DOSE REDUCTION: This exam was performed according to the departmental dose-optimization program which includes automated exposure control, adjustment of the mA and/or kV according to patient size and/or use of iterative reconstruction technique.  COMPARISON:  None Available.  FINDINGS: Lower chest: Mild atelectasis at the lung bases.  Hepatobiliary: No focal liver abnormality is seen. No gallstones, gallbladder wall thickening, or biliary dilatation.  Pancreas: Unremarkable. No pancreatic ductal dilatation or surrounding inflammatory changes.  Spleen: Normal in size  without focal abnormality.  Adrenals/Urinary Tract: The adrenal glands are within normal limits. No renal or ureteral calculus bilaterally. No obstructive uropathy. Subtle perinephric fat stranding is noted on the left. The bladder is unremarkable.  Stomach/Bowel: Stomach is within normal limits. Appendix appears normal. No evidence of bowel wall thickening, distention, or inflammatory changes.  Vascular/Lymphatic: No significant vascular findings are present. No enlarged abdominal or pelvic lymph nodes.  Reproductive: Uterus and bilateral adnexa are unremarkable.  Other: A small amount of free fluid is noted in the cul-de-sac on the right.  Musculoskeletal: Pectus excavatum is noted. No acute osseous abnormality.  IMPRESSION: No renal calculus or obstructive uropathy bilaterally. Mild perinephric fat stranding is noted on the left, which may be due to recently passed stone versus infection.   Electronically Signed By: Brett Fairy M.D. On: 07/19/2021 04:59   Assessment & Plan:    1. Recurrent UTI UA today clear and she is asymptomatic Symptoms may be postcoital related however typically occur just prior to onset of menstrual cycle Trial postcoital prophylaxis nitrofurantoin 50 mg after intercourse.  She may also take 1 tab daily 1-2 days prior to her menstrual cycle PA follow-up 2 months   Abbie Sons, [place] [address] [place], [place] [address] [phone]

## 2022-02-20 DIAGNOSIS — F419 Anxiety disorder, unspecified: Secondary | ICD-10-CM | POA: Diagnosis not present

## 2022-02-20 DIAGNOSIS — Z20822 Contact with and (suspected) exposure to covid-19: Secondary | ICD-10-CM | POA: Diagnosis not present

## 2022-02-20 DIAGNOSIS — R059 Cough, unspecified: Secondary | ICD-10-CM | POA: Diagnosis not present

## 2022-04-15 ENCOUNTER — Encounter: Payer: Self-pay | Admitting: Physician Assistant

## 2022-04-15 ENCOUNTER — Ambulatory Visit: Payer: Federal, State, Local not specified - PPO | Admitting: Physician Assistant

## 2022-04-15 VITALS — BP 110/74 | HR 80 | Ht 63.0 in | Wt 132.7 lb

## 2022-04-15 DIAGNOSIS — R350 Frequency of micturition: Secondary | ICD-10-CM | POA: Diagnosis not present

## 2022-04-15 DIAGNOSIS — N390 Urinary tract infection, site not specified: Secondary | ICD-10-CM | POA: Diagnosis not present

## 2022-04-15 DIAGNOSIS — Z87440 Personal history of urinary (tract) infections: Secondary | ICD-10-CM | POA: Diagnosis not present

## 2022-04-15 DIAGNOSIS — M545 Low back pain, unspecified: Secondary | ICD-10-CM | POA: Diagnosis not present

## 2022-04-15 LAB — URINALYSIS, COMPLETE
Bilirubin, UA: NEGATIVE
Glucose, UA: NEGATIVE
Ketones, UA: NEGATIVE
Nitrite, UA: NEGATIVE
Protein,UA: NEGATIVE
RBC, UA: NEGATIVE
Specific Gravity, UA: 1.02 (ref 1.005–1.030)
Urobilinogen, Ur: 0.2 mg/dL (ref 0.2–1.0)
pH, UA: 7 (ref 5.0–7.5)

## 2022-04-15 LAB — MICROSCOPIC EXAMINATION

## 2022-04-15 MED ORDER — NITROFURANTOIN MACROCRYSTAL 50 MG PO CAPS: ORAL_CAPSULE | ORAL | 0 refills | Status: DC

## 2022-04-15 MED ORDER — NITROFURANTOIN MONOHYD MACRO 100 MG PO CAPS: 100.0000 mg | ORAL_CAPSULE | Freq: Two times a day (BID) | ORAL | 0 refills | Status: AC

## 2022-04-15 NOTE — Progress Notes (Signed)
04/15/2022 10:13 AM   Christina Khan Jan 23, 2001 [id number]  CC: Chief Complaint  Patient presents with   Recurrent UTI   HPI: Christina Khan is a 22 y.o. female with PMH recurrent UTIs associated with initiation of menstruation and sexual activity who was previously prescribed postcoital prophylactic Macrobid by Dr. Bernardo Heater who presents today for follow-up.   Today she reports she developed UTI symptoms immediately after her last office visit with Dr. Bernardo Heater and self treated with the prophylactic Macrobid she was prescribed.  This happened 1 additional time, so she ended up using the entire supply of prophylactic antibiotics for therapeutic purposes and was unable to use it prophylactically.  She is concerned for possible UTI today with 1 to 2 days of low back pain and urinary frequency.  She is not currently menstruating.  Notably, she is on OCPs.  In-office UA today positive for 1+ leukocytes; urine microscopy with 11-30 WBCs/HPF and moderate bacteria.  PMH: Past Medical History:  Diagnosis Date   ADHD    Exercise-induced asthma     Surgical History: Past Surgical History:  Procedure Laterality Date   WISDOM TOOTH EXTRACTION      Home Medications:  Allergies as of 04/15/2022   No Known Allergies      Medication List        Accurate as of April 15, 2022 10:13 AM. If you have any questions, ask your nurse or doctor.          ibuprofen 200 MG tablet Commonly known as: ADVIL Take 800 mg by mouth every 6 (six) hours as needed for headache or moderate pain.   nitrofurantoin (macrocrystal-monohydrate) 100 MG capsule Commonly known as: MACROBID Take 1 capsule (100 mg total) by mouth 2 (two) times daily for 5 days. Started by: Debroah Loop, PA-C   nitrofurantoin 50 MG capsule Commonly known as: MACRODANTIN 1 capsule p.o. after intercourse   Sprintec 28 0.25-35 MG-MCG tablet Generic drug: norgestimate-ethinyl estradiol Take 1 tablet by mouth at  bedtime.        Allergies:  No Known Allergies  Family History: Family History  Problem Relation Age of Onset   Healthy Mother    High Cholesterol Mother    High Cholesterol Father    Healthy Father    High blood pressure Father    Cancer Maternal Grandmother    Alcoholism Maternal Grandmother    Alcoholism Maternal Grandfather    Heart attack Maternal Grandfather     Social History:   reports that she has never smoked. She has never used smokeless tobacco. She reports that she does not currently use alcohol. She reports that she does not use drugs.  Physical Exam: BP 110/74 (BP Location: Left Arm, Patient Position: Sitting, Cuff Size: Normal)   Pulse 80   Ht 5\' 3"  (1.6 m)   Wt 132 lb 11.2 oz (60.2 kg)   BMI 23.51 kg/m   Constitutional:  Alert and oriented, no acute distress, nontoxic appearing HEENT: [place], AT Cardiovascular: No clubbing, cyanosis, or edema Respiratory: Normal respiratory effort, no increased work of breathing Skin: No rashes, bruises or suspicious lesions Neurologic: Grossly intact, no focal deficits, moving all 4 extremities Psychiatric: Normal mood and affect  Laboratory Data: Results for orders placed or performed in visit on 04/15/22  Microscopic Examination   Urine  Result Value Ref Range   WBC, UA 11-30 (A) 0 - 5 /hpf   RBC, Urine 0-2 0 - 2 /hpf   Epithelial Cells (non renal) 0-10 0 -  10 /hpf   Bacteria, UA Moderate (A) None seen/Few  Urinalysis, Complete  Result Value Ref Range   Specific Gravity, UA 1.020 1.005 - 1.030   pH, UA 7.0 5.0 - 7.5   Color, UA Yellow Yellow   Appearance Ur Hazy (A) Clear   Leukocytes,UA 1+ (A) Negative   Protein,UA Negative Negative/Trace   Glucose, UA Negative Negative   Ketones, UA Negative Negative   RBC, UA Negative Negative   Bilirubin, UA Negative Negative   Urobilinogen, Ur 0.2 0.2 - 1.0 mg/dL   Nitrite, UA Negative Negative   Microscopic Examination See below:    Assessment & Plan:   1.  Recurrent UTI UA today appears infected, will start empiric Macrobid and send for culture for further evaluation.  Will also refill prophylactic Macrobid and have her start this postcoitally and around the time of her menses for UTI prevention.  Will plan to follow-up with her in 3 months.  We discussed that low-dose OCPs may be contributory to recurrent bladder infections.  May consider a short course of topical vaginal estrogen cream in the future. - Urinalysis, Complete - CULTURE, URINE COMPREHENSIVE - nitrofurantoin, macrocrystal-monohydrate, (MACROBID) 100 MG capsule; Take 1 capsule (100 mg total) by mouth 2 (two) times daily for 5 days.  Dispense: 10 capsule; Refill: 0 - nitrofurantoin (MACRODANTIN) 50 MG capsule; 1 capsule p.o. after intercourse  Dispense: 30 capsule; Refill: 0  Return in about 3 months (around 07/16/2022) for rUTI follow up with UA if symptomatic.  Debroah Loop, PA-C  [facility] [address] [place], [place] [address] [phone]

## 2022-04-19 LAB — CULTURE, URINE COMPREHENSIVE

## 2022-04-27 ENCOUNTER — Ambulatory Visit: Payer: Federal, State, Local not specified - PPO | Admitting: Physician Assistant

## 2022-04-27 ENCOUNTER — Encounter: Payer: Self-pay | Admitting: Physician Assistant

## 2022-04-27 VITALS — BP 100/63 | HR 70 | Ht 62.0 in | Wt 135.0 lb

## 2022-04-27 DIAGNOSIS — R8281 Pyuria: Secondary | ICD-10-CM | POA: Diagnosis not present

## 2022-04-27 DIAGNOSIS — R350 Frequency of micturition: Secondary | ICD-10-CM | POA: Diagnosis not present

## 2022-04-27 DIAGNOSIS — R8271 Bacteriuria: Secondary | ICD-10-CM

## 2022-04-27 DIAGNOSIS — N390 Urinary tract infection, site not specified: Secondary | ICD-10-CM | POA: Diagnosis not present

## 2022-04-27 DIAGNOSIS — N3289 Other specified disorders of bladder: Secondary | ICD-10-CM

## 2022-04-27 DIAGNOSIS — R3915 Urgency of urination: Secondary | ICD-10-CM

## 2022-04-27 DIAGNOSIS — Z87440 Personal history of urinary (tract) infections: Secondary | ICD-10-CM

## 2022-04-27 LAB — MICROSCOPIC EXAMINATION: WBC, UA: >30 /hpf — AB (ref 0–5)

## 2022-04-27 LAB — URINALYSIS, COMPLETE
Bilirubin, UA: NEGATIVE
Glucose, UA: NEGATIVE
Ketones, UA: NEGATIVE
Nitrite, UA: NEGATIVE
Protein,UA: NEGATIVE
Specific Gravity, UA: 1.025 (ref 1.005–1.030)
Urobilinogen, Ur: 0.2 mg/dL (ref 0.2–1.0)
pH, UA: 5.5 (ref 5.0–7.5)

## 2022-04-27 MED ORDER — DOXYCYCLINE HYCLATE 100 MG PO CAPS: 100.0000 mg | ORAL_CAPSULE | Freq: Two times a day (BID) | ORAL | 0 refills | Status: AC

## 2022-04-27 NOTE — Progress Notes (Unsigned)
04/27/2022 10:42 AM   Christina Khan 2000-03-03 [id number]  CC: Chief Complaint  Patient presents with   Recurrent UTI   HPI: Christina Khan is a 22 y.o. female with PMH recurrent UTI associated with initiation of menstruation and sexual activity on postcoital prophylactic Macrobid who presents today for evaluation of possible UTI.  I saw her in clinic most recently on 04/15/2022 for recurrent UTI follow-up.  She was concerned for infection that day.  UA was notable for pyuria and bacteriuria.  I started empiric Macrobid, however urine culture finalized with mixed urogenital flora.  Today she reports her UTI symptoms completely resolved after her most recent round of Macrobid.  Yesterday she had acute return of frequency, urgency, and cramping bladder discomfort.  She is not concerned for STIs today and declines testing.  In-office UA today positive for trace intact blood and 2+ leukocytes; urine microscopy with >30 WBCs/HPF, granular casts, and moderate bacteria.  PMH: Past Medical History:  Diagnosis Date   ADHD    Exercise-induced asthma     Surgical History: Past Surgical History:  Procedure Laterality Date   WISDOM TOOTH EXTRACTION      Home Medications:  Allergies as of 04/27/2022   No Known Allergies      Medication List        Accurate as of April 27, 2022 10:42 AM. If you have any questions, ask your nurse or doctor.          ibuprofen 200 MG tablet Commonly known as: ADVIL Take 800 mg by mouth every 6 (six) hours as needed for headache or moderate pain.   nitrofurantoin 50 MG capsule Commonly known as: MACRODANTIN 1 capsule p.o. after intercourse   Sprintec 28 0.25-35 MG-MCG tablet Generic drug: norgestimate-ethinyl estradiol Take 1 tablet by mouth at bedtime.        Allergies:  No Known Allergies  Family History: Family History  Problem Relation Age of Onset   Healthy Mother    High Cholesterol Mother    High Cholesterol Father     Healthy Father    High blood pressure Father    Cancer Maternal Grandmother    Alcoholism Maternal Grandmother    Alcoholism Maternal Grandfather    Heart attack Maternal Grandfather     Social History:   reports that she has never smoked. She has never used smokeless tobacco. She reports that she does not currently use alcohol. She reports that she does not use drugs.  Physical Exam: BP 100/63   Pulse 70   Ht 5\' 2"  (1.575 m)   Wt 135 lb (61.2 kg)   BMI 24.69 kg/m   Constitutional:  Alert and oriented, no acute distress, nontoxic appearing HEENT: [place], AT Cardiovascular: No clubbing, cyanosis, or edema Respiratory: Normal respiratory effort, no increased work of breathing Skin: No rashes, bruises or suspicious lesions Neurologic: Grossly intact, no focal deficits, moving all 4 extremities Psychiatric: Normal mood and affect  Laboratory Data: Results for orders placed or performed in visit on 04/27/22  Microscopic Examination   Urine  Result Value Ref Range   WBC, UA >30 (A) 0 - 5 /hpf   RBC, Urine 0-2 0 - 2 /hpf   Epithelial Cells (non renal) 0-10 0 - 10 /hpf   Casts Present (A) None seen /lpf   Cast Type Granular casts (A) N/A   Mucus, UA Present (A) Not Estab.   Bacteria, UA Moderate (A) None seen/Few  Urinalysis, Complete  Result Value Ref Range  Specific Gravity, UA 1.025 1.005 - 1.030   pH, UA 5.5 5.0 - 7.5   Color, UA Yellow Yellow   Appearance Ur Hazy (A) Clear   Leukocytes,UA 2+ (A) Negative   Protein,UA Negative Negative/Trace   Glucose, UA Negative Negative   Ketones, UA Negative Negative   RBC, UA Trace (A) Negative   Bilirubin, UA Negative Negative   Urobilinogen, Ur 0.2 0.2 - 1.0 mg/dL   Nitrite, UA Negative Negative   Microscopic Examination See below:    Assessment & Plan:   1. Recurrent UTI Recurrent urgency, frequency, and bladder discomfort consistent with acute cystitis.  UA today notable for pyuria and bacteriuria, though similar UA recently  finalized with MUF.  Will start empiric doxycycline and send for standard and atypical cultures.  If cultures are positive, will consider daily prophylactic antibiotics and or short course of topical vaginal estrogen cream.  If negative again, recommend proceeding with cystoscopy for further evaluation.  She is in agreement with this plan. - Urinalysis, Complete - CULTURE, URINE COMPREHENSIVE - Mycoplasma / ureaplasma culture - doxycycline (VIBRAMYCIN) 100 MG capsule; Take 1 capsule (100 mg total) by mouth 2 (two) times daily for 7 days.  Dispense: 14 capsule; Refill: 0   Return for Will call with results.  Debroah Loop, PA-C  [facility] [place] [address] [place], [place] [address] [phone]

## 2022-05-01 LAB — CULTURE, URINE COMPREHENSIVE

## 2022-05-04 LAB — MYCOPLASMA / UREAPLASMA CULTURE
Mycoplasma hominis Culture: NEGATIVE
Ureaplasma urealyticum: NEGATIVE

## 2022-06-19 DIAGNOSIS — N390 Urinary tract infection, site not specified: Secondary | ICD-10-CM | POA: Diagnosis not present

## 2022-06-19 DIAGNOSIS — F419 Anxiety disorder, unspecified: Secondary | ICD-10-CM | POA: Diagnosis not present

## 2022-07-16 ENCOUNTER — Ambulatory Visit: Payer: Federal, State, Local not specified - PPO | Admitting: Physician Assistant

## 2022-07-16 VITALS — BP 110/70 | HR 79 | Ht 62.0 in | Wt 136.2 lb

## 2022-07-16 DIAGNOSIS — R3915 Urgency of urination: Secondary | ICD-10-CM

## 2022-07-16 DIAGNOSIS — N390 Urinary tract infection, site not specified: Secondary | ICD-10-CM

## 2022-07-16 DIAGNOSIS — Z87440 Personal history of urinary (tract) infections: Secondary | ICD-10-CM

## 2022-07-16 DIAGNOSIS — R350 Frequency of micturition: Secondary | ICD-10-CM | POA: Diagnosis not present

## 2022-07-16 DIAGNOSIS — R300 Dysuria: Secondary | ICD-10-CM

## 2022-07-16 LAB — URINALYSIS, COMPLETE
Bilirubin, UA: NEGATIVE
Glucose, UA: NEGATIVE
Ketones, UA: NEGATIVE
Nitrite, UA: NEGATIVE
Protein,UA: NEGATIVE
Specific Gravity, UA: 1.025 (ref 1.005–1.030)
Urobilinogen, Ur: 0.2 mg/dL (ref 0.2–1.0)
pH, UA: 6 (ref 5.0–7.5)

## 2022-07-16 LAB — MICROSCOPIC EXAMINATION: Epithelial Cells (non renal): >10 /hpf — AB (ref 0–10)

## 2022-07-16 MED ORDER — SULFAMETHOXAZOLE-TRIMETHOPRIM 800-160 MG PO TABS: 1.0000 | ORAL_TABLET | Freq: Two times a day (BID) | ORAL | 0 refills | Status: AC

## 2022-07-16 NOTE — Patient Instructions (Signed)

## 2022-07-16 NOTE — Progress Notes (Signed)
07/16/2022 12:56 PM   Christina Khan [birth year] [id number]  CC: Chief Complaint  Patient presents with   Recurrent UTI   HPI: Christina Khan is a 22 y.o. female with PMH recurrent UTI associated with initiation of menstruation and sexual activity previously on postcoital prophylactic Macrobid who presents today for evaluation of possible UTI.   She was taking Macrobid for UTI prevention around the time of menstruation and postcoitally, but still developed UTI symptoms 1 to 2 months ago.  This was treated at [facility] urgent care in [place], and she states that she was informed that the Macrobid was not working for UTI prevention.  She was treated with a different antibiotic and her symptoms resolved.  Today she reports her symptoms returned yesterday.  She notes increased acne vulgaris 1 to 2 weeks prior to UTI symptoms starting, and then her period typically starts thereafter.  This time, her symptoms do not follow that usual pattern.  She has not taken any medication yet to treat UTI and she is not currently menstruating.  CT stone study dated 07/19/2021 with no urolithiasis.  In-office UA today positive for trace intact blood and trace leukocytes; urine microscopy with 6-10 WBCs/HPF, 3-10 RBCs/HPF, >10 epithelial cells/hpf, and many bacteria.   PMH: Past Medical History:  Diagnosis Date   ADHD    Exercise-induced asthma     Surgical History: Past Surgical History:  Procedure Laterality Date   WISDOM TOOTH EXTRACTION      Home Medications:  Allergies as of 07/16/2022   No Known Allergies      Medication List        Accurate as of July 16, 2022 12:56 PM. If you have any questions, ask your nurse or doctor.          STOP taking these medications    nitrofurantoin 50 MG capsule Commonly known as: MACRODANTIN       TAKE these medications    ibuprofen 200 MG tablet Commonly known as: ADVIL Take 800 mg by mouth every 6 (six) hours as needed for headache  or moderate pain.   Sprintec 28 0.25-35 MG-MCG tablet Generic drug: norgestimate-ethinyl estradiol Take 1 tablet by mouth at bedtime.   sulfamethoxazole-trimethoprim 800-160 MG tablet Commonly known as: BACTRIM DS Take 1 tablet by mouth 2 (two) times daily for 5 days.        Allergies:  No Known Allergies  Family History: Family History  Problem Relation Age of Onset   Healthy Mother    High Cholesterol Mother    High Cholesterol Father    Healthy Father    High blood pressure Father    Cancer Maternal Grandmother    Alcoholism Maternal Grandmother    Alcoholism Maternal Grandfather    Heart attack Maternal Grandfather     Social History:   reports that she has never smoked. She has never used smokeless tobacco. She reports that she does not currently use alcohol. She reports that she does not use drugs.  Physical Exam: BP 110/70   Pulse 79   Ht 5\' 2"  (1.575 m)   Wt 136 lb 4 oz (61.8 kg)   BMI 24.92 kg/m   Constitutional:  Alert and oriented, no acute distress, nontoxic appearing HEENT: [place], AT Cardiovascular: No clubbing, cyanosis, or edema Respiratory: Normal respiratory effort, no increased work of breathing Skin: No rashes, bruises or suspicious lesions Neurologic: Grossly intact, no focal deficits, moving all 4 extremities Psychiatric: Normal mood and affect  Laboratory Data: Results for  orders placed or performed in visit on 07/16/22  Microscopic Examination   Urine  Result Value Ref Range   WBC, UA 6-10 (A) 0 - 5 /hpf   RBC, Urine 3-10 (A) 0 - 2 /hpf   Epithelial Cells (non renal) >10 (A) 0 - 10 /hpf   Mucus, UA Present (A) Not Estab.   Bacteria, UA Many (A) None seen/Few  Urinalysis, Complete  Result Value Ref Range   Specific Gravity, UA 1.025 1.005 - 1.030   pH, UA 6.0 5.0 - 7.5   Color, UA Yellow Yellow   Appearance Ur Clear Clear   Leukocytes,UA Trace (A) Negative   Protein,UA Negative Negative/Trace   Glucose, UA Negative Negative    Ketones, UA Negative Negative   RBC, UA Trace (A) Negative   Bilirubin, UA Negative Negative   Urobilinogen, Ur 0.2 0.2 - 1.0 mg/dL   Nitrite, UA Negative Negative   Microscopic Examination See below:    Assessment & Plan:   1. Recurrent UTI Recurrent frequency/urgency and dysuria.  UA is not particularly impressive, though it does appear contaminated.  Will send for standard and atypical cultures again and start empiric Bactrim.  I recommended cystoscopy for further evaluation of her frequent UTIs.  I did find the timing of UTI onset around her menstrual cycle to be interesting, and raises suspicion for a more IC or endometriosis type picture, though admittedly she does have positive urine cultures to support a recurrent UTI diagnosis.  I still think she may benefit from a short course of vaginal estrogen cream in the future given OCP use. - Urinalysis, Complete - CULTURE, URINE COMPREHENSIVE - Mycoplasma / ureaplasma culture - sulfamethoxazole-trimethoprim (BACTRIM DS) 800-160 MG tablet; Take 1 tablet by mouth 2 (two) times daily for 5 days.  Dispense: 10 tablet; Refill: 0   Return in about 2 weeks (around 07/30/2022) for Cysto with Dr. Lonna Cobb.  Carman Ching, PA-C  [facility] [place] [address] [place], [place] [address] [phone]

## 2022-07-20 ENCOUNTER — Other Ambulatory Visit: Payer: Self-pay

## 2022-07-20 DIAGNOSIS — N390 Urinary tract infection, site not specified: Secondary | ICD-10-CM

## 2022-07-20 MED ORDER — DOXYCYCLINE HYCLATE 100 MG PO CAPS: 100.0000 mg | ORAL_CAPSULE | Freq: Two times a day (BID) | ORAL | 0 refills | Status: DC

## 2022-07-21 LAB — CULTURE, URINE COMPREHENSIVE

## 2022-07-22 LAB — MYCOPLASMA / UREAPLASMA CULTURE
Mycoplasma hominis Culture: NEGATIVE
Ureaplasma urealyticum: POSITIVE — AB

## 2022-08-05 ENCOUNTER — Ambulatory Visit: Payer: Federal, State, Local not specified - PPO | Admitting: Urology

## 2022-08-05 ENCOUNTER — Encounter: Payer: Self-pay | Admitting: Urology

## 2022-08-05 VITALS — BP 115/76 | HR 77 | Ht 68.0 in | Wt 136.0 lb

## 2022-08-05 DIAGNOSIS — N390 Urinary tract infection, site not specified: Secondary | ICD-10-CM | POA: Diagnosis not present

## 2022-08-05 DIAGNOSIS — Z87440 Personal history of urinary (tract) infections: Secondary | ICD-10-CM | POA: Diagnosis not present

## 2022-08-05 LAB — URINALYSIS, COMPLETE
Bilirubin, UA: NEGATIVE
Glucose, UA: NEGATIVE
Ketones, UA: NEGATIVE
Leukocytes,UA: NEGATIVE
Nitrite, UA: NEGATIVE
Protein,UA: NEGATIVE
Specific Gravity, UA: 1.015 (ref 1.005–1.030)
Urobilinogen, Ur: 0.2 mg/dL (ref 0.2–1.0)
pH, UA: 7 (ref 5.0–7.5)

## 2022-08-05 LAB — MICROSCOPIC EXAMINATION: RBC, Urine: >30 /hpf — AB (ref 0–2)

## 2022-08-05 MED ORDER — TRIMETHOPRIM 100 MG PO TABS: 100.0000 mg | ORAL_TABLET | Freq: Every day | ORAL | 1 refills | Status: DC

## 2022-08-05 NOTE — Progress Notes (Signed)
   08/05/22  CC:  Chief Complaint  Patient presents with   Cysto    HPI: Refer to [facility]'s previous office note 07/16/2022.  Blood pressure 115/76, pulse 77, height 5\' 8"  (1.727 m), weight 136 lb (61.7 kg). NED. A&Ox3.   No respiratory distress   Abd soft, NT, ND Normal external genitalia with patent urethral meatus  Cystoscopy Procedure Note  Patient identification was confirmed, informed consent was obtained, and patient was prepped using Betadine solution.  Lidocaine jelly was administered per urethral meatus.    Procedure: - Flexible cystoscope introduced, without any difficulty.   - Thorough search of the bladder revealed:    normal urethral meatus    normal urothelium    no stones    no ulcers     no tumors    no urethral polyps    no trabeculation  - Ureteral orifices were normal in position and appearance.  Post-Procedure: - Patient tolerated the procedure well  Assessment/ Plan: No abnormalities on cystoscopy noted CT abdomen pelvis without contrast June 2023 showed no anatomic abnormalities or urinary calculi Extended low-dose antibiotic prophylaxis trimethoprim 100 mg daily x 6-8 weeks PA follow-up 3 months and instructed call earlier for recurrent UTI symptoms    Riki Altes, MD

## 2022-08-09 LAB — CULTURE, URINE COMPREHENSIVE

## 2022-08-10 LAB — CULTURE, URINE COMPREHENSIVE

## 2022-08-13 ENCOUNTER — Ambulatory Visit: Payer: Federal, State, Local not specified - PPO | Admitting: Urology

## 2022-08-13 ENCOUNTER — Encounter: Payer: Self-pay | Admitting: Urology

## 2022-08-13 VITALS — BP 108/71 | HR 82 | Ht 62.0 in | Wt 135.0 lb

## 2022-08-13 DIAGNOSIS — Z87440 Personal history of urinary (tract) infections: Secondary | ICD-10-CM | POA: Diagnosis not present

## 2022-08-13 DIAGNOSIS — R350 Frequency of micturition: Secondary | ICD-10-CM

## 2022-08-13 DIAGNOSIS — R102 Pelvic and perineal pain: Secondary | ICD-10-CM | POA: Diagnosis not present

## 2022-08-13 DIAGNOSIS — N390 Urinary tract infection, site not specified: Secondary | ICD-10-CM | POA: Diagnosis not present

## 2022-08-13 LAB — MICROSCOPIC EXAMINATION
Bacteria, UA: NONE SEEN
RBC, Urine: NONE SEEN /hpf (ref 0–2)
WBC, UA: NONE SEEN /hpf (ref 0–5)

## 2022-08-13 LAB — URINALYSIS, COMPLETE
Bilirubin, UA: NEGATIVE
Glucose, UA: NEGATIVE
Ketones, UA: NEGATIVE
Leukocytes,UA: NEGATIVE
Nitrite, UA: NEGATIVE
Protein,UA: NEGATIVE
RBC, UA: NEGATIVE
Specific Gravity, UA: 1.01 (ref 1.005–1.030)
Urobilinogen, Ur: 0.2 mg/dL (ref 0.2–1.0)
pH, UA: 5.5 (ref 5.0–7.5)

## 2022-08-13 MED ORDER — TRIMETHOPRIM 100 MG PO TABS: 100.0000 mg | ORAL_TABLET | Freq: Every day | ORAL | 0 refills | Status: AC

## 2022-08-15 NOTE — Progress Notes (Signed)
08/13/2022 9:30 PM   Christina Khan [birth year] [id number]  Referring provider: Carlean Jews, NP [address] [place],  [place] [address]  Urological history: 1. rUTI's -contributing factors of sexually active -CT (2023) - NED -cysto (07/2022) - NED  -documented urine cultures over the last year  August 05, 2022 MUF  June 21, Positive for ureaplasma urealyticum   July 16, 2022 - 2,000 colonies of Beta hemolytic Streptococcus, group B  April 27, 2022 - E.coli  April 15, 2022 - MUF  September 01, 2021 - E.coli -trimethoprim 100 mg daily   Chief Complaint  Patient presents with   Recurrent UTI   Acute Visit    HPI: Christina Khan is a 22 y.o. female who presents today for frequent urination.   Previous records reviewed.   She has been experiencing pain and pressure in the suprapubic region along with urinary frequency.  Patient denies any modifying or aggravating factors.  Patient denies any gross hematuria, dysuria or flank pain.  Patient denies any fevers, chills, nausea or vomiting.   She is on oral birth control pills, so she does not have regular menstrual cycles.  She has some instances of UTI's after intercourse, but it is not consistence   UA yellow clear, specific gravity 1.010, pH 5.5 and 0-10 epithelial cells.   PMH: Past Medical History:  Diagnosis Date   ADHD    Exercise-induced asthma     Surgical History: Past Surgical History:  Procedure Laterality Date   WISDOM TOOTH EXTRACTION      Home Medications:  Allergies as of 08/13/2022   No Known Allergies      Medication List        Accurate as of August 13, 2022 11:59 PM. If you have any questions, ask your nurse or doctor.          STOP taking these medications    doxycycline 100 MG capsule Commonly known as: VIBRAMYCIN Stopped by: Derrion Tritz       TAKE these medications    ibuprofen 200 MG tablet Commonly known as: ADVIL Take 800 mg by mouth every 6 (six) hours  as needed for headache or moderate pain.   Sprintec 28 0.25-35 MG-MCG tablet Generic drug: norgestimate-ethinyl estradiol Take 1 tablet by mouth at bedtime.   trimethoprim 100 MG tablet Commonly known as: TRIMPEX Take 1 tablet (100 mg total) by mouth daily.        Allergies: No Known Allergies  Family History: Family History  Problem Relation Age of Onset   Healthy Mother    High Cholesterol Mother    High Cholesterol Father    Healthy Father    High blood pressure Father    Cancer Maternal Grandmother    Alcoholism Maternal Grandmother    Alcoholism Maternal Grandfather    Heart attack Maternal Grandfather     Social History:  reports that she has never smoked. She has never been exposed to tobacco smoke. She has never used smokeless tobacco. She reports that she does not currently use alcohol. She reports that she does not use drugs.  ROS: Pertinent ROS in HPI  Physical Exam: BP 108/71   Pulse 82   Ht 5\' 2"  (1.575 m)   Wt 135 lb (61.2 kg)   BMI 24.69 kg/m   Constitutional:  Well nourished. Alert and oriented, No acute distress. HEENT: [place] AT, moist mucus membranes.  Trachea midline, no masses. Cardiovascular: No clubbing, cyanosis, or edema. Respiratory: Normal respiratory  effort, no increased work of breathing. Neurologic: Grossly intact, no focal deficits, moving all 4 extremities. Psychiatric: Normal mood and affect.  Laboratory Data: Urinalysis Results for orders placed or performed in visit on 08/13/22  Microscopic Examination   Urine  Result Value Ref Range   WBC, UA None seen 0 - 5 /hpf   RBC, Urine None seen 0 - 2 /hpf   Epithelial Cells (non renal) 0-10 0 - 10 /hpf   Bacteria, UA None seen None seen/Few  Urinalysis, Complete  Result Value Ref Range   Specific Gravity, UA 1.010 1.005 - 1.030   pH, UA 5.5 5.0 - 7.5   Color, UA Yellow Yellow   Appearance Ur Clear Clear   Leukocytes,UA Negative Negative   Protein,UA Negative Negative/Trace    Glucose, UA Negative Negative   Ketones, UA Negative Negative   RBC, UA Negative Negative   Bilirubin, UA Negative Negative   Urobilinogen, Ur 0.2 0.2 - 1.0 mg/dL   Nitrite, UA Negative Negative   Microscopic Examination See below:   I have reviewed the labs.   Pertinent Imaging: N/A  Assessment & Plan:    1. Recurrent UTI -UA negative  - CULTURE, URINE COMPREHENSIVE - Urinalysis, Complete - trimethoprim (TRIMPEX) 100 MG tablet; Take 1 tablet (100 mg total) by mouth daily.  Dispense: 90 tablet; Refill: 0 -she will take AZO OTC for symptoms while waiting on urine culture results -if urine culture is negative, will need to consider OAB, IC or endometriosis as potential causes of her symptoms   Return for pending urine culture results .  These notes generated with voice recognition software. I apologize for typographical errors.  Cloretta Ned  [facility] [address]  [address] [place], [place] [address] [phone]

## 2022-08-16 LAB — CULTURE, URINE COMPREHENSIVE

## 2022-11-05 ENCOUNTER — Encounter: Payer: Self-pay | Admitting: Physician Assistant

## 2022-11-05 ENCOUNTER — Ambulatory Visit: Payer: Federal, State, Local not specified - PPO | Admitting: Physician Assistant

## 2022-11-05 VITALS — BP 95/65 | HR 77 | Ht 62.0 in | Wt 135.0 lb

## 2022-11-05 DIAGNOSIS — R350 Frequency of micturition: Secondary | ICD-10-CM | POA: Diagnosis not present

## 2022-11-05 DIAGNOSIS — N390 Urinary tract infection, site not specified: Secondary | ICD-10-CM | POA: Diagnosis not present

## 2022-11-05 DIAGNOSIS — Z87440 Personal history of urinary (tract) infections: Secondary | ICD-10-CM

## 2022-11-05 LAB — URINALYSIS, COMPLETE
Bilirubin, UA: NEGATIVE
Glucose, UA: NEGATIVE
Ketones, UA: NEGATIVE
Nitrite, UA: NEGATIVE
Protein,UA: NEGATIVE
RBC, UA: NEGATIVE
Specific Gravity, UA: 1.015 (ref 1.005–1.030)
Urobilinogen, Ur: 0.2 mg/dL (ref 0.2–1.0)
pH, UA: 6 (ref 5.0–7.5)

## 2022-11-05 LAB — MICROSCOPIC EXAMINATION: Epithelial Cells (non renal): >10 /[HPF] — AB (ref 0–10)

## 2022-11-05 NOTE — Progress Notes (Signed)
11/05/2022 11:50 AM   Christina Khan [birth year] [id number]  CC: Chief Complaint  Patient presents with   Recurrent UTI   HPI: Christina Khan is a 22 y.o. female with PMH recurrent UTI on suppressive trimethoprim and OCPs who presents today for 4-month follow-up.   Today she reports she remains on daily trimethoprim.  She will occasionally feel like she is developing a UTI, but her symptoms will resolve on their own.  This most recently happened last week, and she is having some persistent frequency every 2 hours today.  She denies dysuria.  In-office UA today positive for 1+ leukocytes; urine microscopy with 11-30 WBCs/HPF and >10 epithelial cells/hpf.  PMH: Past Medical History:  Diagnosis Date   ADHD    Exercise-induced asthma     Surgical History: Past Surgical History:  Procedure Laterality Date   WISDOM TOOTH EXTRACTION      Home Medications:  Allergies as of 11/05/2022   No Known Allergies      Medication List        Accurate as of November 05, 2022 11:50 AM. If you have any questions, ask your nurse or doctor.          ibuprofen 200 MG tablet Commonly known as: ADVIL Take 800 mg by mouth every 6 (six) hours as needed for headache or moderate pain.   Sprintec 28 0.25-35 MG-MCG tablet Generic drug: norgestimate-ethinyl estradiol Take 1 tablet by mouth at bedtime.   trimethoprim 100 MG tablet Commonly known as: TRIMPEX Take 1 tablet (100 mg total) by mouth daily.        Allergies:  No Known Allergies  Family History: Family History  Problem Relation Age of Onset   Healthy Mother    High Cholesterol Mother    High Cholesterol Father    Healthy Father    High blood pressure Father    Cancer Maternal Grandmother    Alcoholism Maternal Grandmother    Alcoholism Maternal Grandfather    Heart attack Maternal Grandfather     Social History:   reports that she has never smoked. She has never been exposed to tobacco smoke. She has never  used smokeless tobacco. She reports that she does not currently use alcohol. She reports that she does not use drugs.  Physical Exam: BP 95/65   Pulse 77   Ht 5\' 2"  (1.575 m)   Wt 135 lb (61.2 kg)   BMI 24.69 kg/m   Constitutional:  Alert and oriented, no acute distress, nontoxic appearing HEENT: [place], AT Cardiovascular: No clubbing, cyanosis, or edema Respiratory: Normal respiratory effort, no increased work of breathing Skin: No rashes, bruises or suspicious lesions Neurologic: Grossly intact, no focal deficits, moving all 4 extremities Psychiatric: Normal mood and affect  Laboratory Data: Results for orders placed or performed in visit on 11/05/22  Microscopic Examination   Urine  Result Value Ref Range   WBC, UA 11-30 (A) 0 - 5 /hpf   RBC, Urine 0-2 0 - 2 /hpf   Epithelial Cells (non renal) >10 (A) 0 - 10 /hpf   Bacteria, UA Few None seen/Few  Urinalysis, Complete  Result Value Ref Range   Specific Gravity, UA 1.015 1.005 - 1.030   pH, UA 6.0 5.0 - 7.5   Color, UA Yellow Yellow   Appearance Ur Hazy (A) Clear   Leukocytes,UA 1+ (A) Negative   Protein,UA Negative Negative/Trace   Glucose, UA Negative Negative   Ketones, UA Negative Negative   RBC, UA Negative Negative  Bilirubin, UA Negative Negative   Urobilinogen, Ur 0.2 0.2 - 1.0 mg/dL   Nitrite, UA Negative Negative   Microscopic Examination See below:    Assessment & Plan:   1. Recurrent UTI No culture proven UTIs on suppressive trimethoprim.  Her only symptom today is frequency, suspect underlying OAB.  She left a urine specimen over her lunch break and I will contact her to discuss results. - Urinalysis, Complete - CULTURE, URINE COMPREHENSIVE  2. Urinary frequency Would recommend trying OAB med, I think this is contributing to her ?UTI symptoms.  Return for Will call with results.  Carman Ching, PA-C  [facility] [place] [address] [place], [place]  [address] [phone]

## 2022-11-08 LAB — CULTURE, URINE COMPREHENSIVE

## 2022-12-29 ENCOUNTER — Encounter: Payer: Federal, State, Local not specified - PPO | Admitting: Nurse Practitioner

## 2023-01-28 ENCOUNTER — Other Ambulatory Visit: Payer: Self-pay | Admitting: Nurse Practitioner

## 2023-01-28 DIAGNOSIS — Z30011 Encounter for initial prescription of contraceptive pills: Secondary | ICD-10-CM

## 2023-11-22 ENCOUNTER — Ambulatory Visit (INDEPENDENT_AMBULATORY_CARE_PROVIDER_SITE_OTHER)

## 2023-11-22 VITALS — BP 114/80 | HR 94 | Temp 98.4°F | Ht 62.0 in | Wt 140.0 lb

## 2023-11-22 DIAGNOSIS — N911 Secondary amenorrhea: Secondary | ICD-10-CM | POA: Diagnosis not present

## 2023-11-22 NOTE — Assessment & Plan Note (Addendum)
 Secondary amenorrhea post-Loestrin discontinuation in July. Withdrawal bleeding occurred, followed by minimal spotting in September. Negative pregnancy tests. Spotting suggests possible ovarian function return. Differential includes hormonal imbalances or thyroid dysfunction, less likely due to recent spotting. - Monitor menstrual cycle for eight weeks as she was advised that it may take up to 6 months or more for periods to return after discontinuation of OCP - Order lab work in January if no normal cycle: estrogen, FSH, LH, thyroid tests. - Consider official pregnancy test if needed. - Refer to [facility] for further evaluation if labs normal and amenorrhea persists. - Patient due for pap smear. Will plan to complete at CPE in 2026

## 2023-11-22 NOTE — Progress Notes (Signed)
 Acute Office Visit  Subjective:     Patient ID: Christina Khan, female    DOB: [birth year], 23 y.o.   MRN: [id number]  Chief Complaint  Patient presents with   Menstrual Problem    HPI  History of Present Illness   Christina Khan is a 23 year old female who presents with irregular menstrual cycles after discontinuing birth control.  Menstrual irregularity - Irregular menstrual cycles since discontinuing Loestrin in early July 2025 - Previously on Sprintec  for majority of time, then switched to Loestrin for 1-2 years - Withdrawal bleeding immediately after stopping Loestrin - No menstrual period until October 11, 2023, when she experienced two days of light pink spotting - No further bleeding since October 11, 2023 - Ongoing menstrual irregularity for nearly four months since stopping birth control  Pregnancy evaluation - Multiple pregnancy tests performed at home which were all negative, no concern for pregnancy test at this time  Associated symptoms - No pelvic pain or cramping - No changes in bowel habits - No symptoms suggestive of thyroid dysfunction, including weight gain or hair loss      ROS Per HPI     Objective:    BP 114/80   Pulse 94   Temp 98.4 F (36.9 C) (Oral)   Ht 5' 2 (1.575 m)   Wt 140 lb 0.6 oz (63.5 kg)   SpO2 98%   BMI 25.61 kg/m    Physical Exam Constitutional:      General: She is not in acute distress.    Appearance: Normal appearance.  Cardiovascular:     Rate and Rhythm: Normal rate and regular rhythm.     Heart sounds: Normal heart sounds. No murmur heard.    No friction rub. No gallop.  Pulmonary:     Effort: Pulmonary effort is normal. No respiratory distress.     Breath sounds: Normal breath sounds.  Abdominal:     General: Bowel sounds are normal.     Palpations: Abdomen is soft.     Tenderness: There is no abdominal tenderness.  Musculoskeletal:        General: No swelling.     Cervical back: Neck supple.   Lymphadenopathy:     Cervical: No cervical adenopathy.  Skin:    General: Skin is warm and dry.  Neurological:     General: No focal deficit present.     Mental Status: She is alert.  Psychiatric:        Mood and Affect: Mood normal.        Behavior: Behavior normal.        Thought Content: Thought content normal.     No results found for any visits on 11/22/23.      Assessment & Plan:    Secondary amenorrhea Assessment & Plan: Secondary amenorrhea post-Loestrin discontinuation in July. Withdrawal bleeding occurred, followed by minimal spotting in September. Negative pregnancy tests. Spotting suggests possible ovarian function return. Differential includes hormonal imbalances or thyroid dysfunction, less likely due to recent spotting. - Monitor menstrual cycle for eight weeks as she was advised that it may take up to 6 months or more for periods to return after discontinuation of OCP - Order lab work in January if no normal cycle: estrogen, FSH, LH, thyroid tests. - Consider official pregnancy test if needed. - Refer to [facility] for further evaluation if labs normal and amenorrhea persists. - Patient due for pap smear. Will plan to complete at CPE in 2026     Return  in about 4 months (around 03/24/2024) for Physical.  Saddie JULIANNA Sacks, PA-C

## 2023-11-22 NOTE — Patient Instructions (Signed)
 VISIT SUMMARY: Today, you were seen for irregular menstrual cycles after stopping birth control. We discussed your symptoms, including the absence of periods since September, and reviewed your negative pregnancy tests.  YOUR PLAN: SECONDARY AMENORRHEA AFTER DISCONTINUATION OF ORAL CONTRACEPTIVES: You have not had regular menstrual cycles since stopping Loestrin in July. This could be due to hormonal imbalances or thyroid issues, but recent spotting suggests your ovarian function might be returning. -Monitor your menstrual cycle for the next eight weeks. -If you do not have a normal cycle by January, we will order lab tests to check your estrogen, FSH, LH, and thyroid levels. -If needed, we can perform an official pregnancy test. -If your lab results are normal and you still do not have a period, we will refer you to an OB for further evaluation.  GENERAL HEALTH MAINTENANCE: You are due for a physical exam and a Pap smear. -Schedule a physical exam within the year. -We will plan to do your pap smear at this visit. -We may consider additional tests for A1c, kidney, and liver function during your next blood draw if needed. -We discussed the flu vaccination, which you declined.  If you have any problems before your next visit feel free to message me via MyChart (minor issues or questions) or call the office, otherwise you may reach out to schedule an office visit.  Thank you! Saddie Sacks, PA-C

## 2024-02-07 ENCOUNTER — Telehealth: Payer: Self-pay

## 2024-02-07 ENCOUNTER — Other Ambulatory Visit: Payer: Self-pay

## 2024-02-07 DIAGNOSIS — N912 Amenorrhea, unspecified: Secondary | ICD-10-CM

## 2024-02-07 NOTE — Telephone Encounter (Signed)
 Copied from CRM [id number]. Topic: Clinical - Request for Lab/Test Order >> Feb 07, 2024 12:23 PM Rosaria BRAVO wrote: Reason for CRM: Pt needs lab orders for estrogen, FSH, LH, thyroid tests.  Best contact: [phone]

## 2024-02-07 NOTE — Telephone Encounter (Signed)
 Orders are in -- please get her scheduled for a lab appt this week or next

## 2024-02-14 ENCOUNTER — Other Ambulatory Visit

## 2024-02-14 DIAGNOSIS — N912 Amenorrhea, unspecified: Secondary | ICD-10-CM

## 2024-02-15 LAB — THYROID PANEL WITH TSH
Free Thyroxine Index: 1.7 (ref 1.2–4.9)
T3 Uptake Ratio: 27 % (ref 24–39)
T4, Total: 6.2 ug/dL (ref 4.5–12.0)
TSH: 2.81 u[IU]/mL (ref 0.450–4.500)

## 2024-02-15 LAB — ESTRADIOL: Estradiol: 72.3 pg/mL

## 2024-02-15 LAB — PROLACTIN: Prolactin: 30.7 ng/mL (ref 4.8–33.4)

## 2024-02-15 LAB — HCG, SERUM, QUALITATIVE: hCG,Beta Subunit,Qual,Serum: NEGATIVE m[IU]/mL

## 2024-02-15 LAB — FSH/LH
FSH: 8 m[IU]/mL
LH: 18.4 m[IU]/mL

## 2024-02-15 LAB — PROGESTERONE: Progesterone: 1.8 ng/mL

## 2024-02-16 ENCOUNTER — Ambulatory Visit: Payer: Self-pay

## 2024-03-26 ENCOUNTER — Encounter
